# Patient Record
Sex: Male | Born: 1960 | Race: Asian | Hispanic: No | Marital: Married | Smoking: Never smoker
Health system: Southern US, Community
[De-identification: ages and names within clinical notes are randomized; demographics above are authoritative.]

## PROBLEM LIST (undated history)

## (undated) DIAGNOSIS — N400 Enlarged prostate without lower urinary tract symptoms: Secondary | ICD-10-CM

## (undated) DIAGNOSIS — N401 Enlarged prostate with lower urinary tract symptoms: Secondary | ICD-10-CM

## (undated) DIAGNOSIS — R3912 Poor urinary stream: Secondary | ICD-10-CM

## (undated) DIAGNOSIS — Z87438 Personal history of other diseases of male genital organs: Secondary | ICD-10-CM

## (undated) DIAGNOSIS — I10 Essential (primary) hypertension: Secondary | ICD-10-CM

## (undated) DIAGNOSIS — N410 Acute prostatitis: Secondary | ICD-10-CM

## (undated) DIAGNOSIS — C679 Malignant neoplasm of bladder, unspecified: Secondary | ICD-10-CM

## (undated) DIAGNOSIS — R309 Painful micturition, unspecified: Secondary | ICD-10-CM

## (undated) DIAGNOSIS — Z973 Presence of spectacles and contact lenses: Secondary | ICD-10-CM

## (undated) HISTORY — DX: Acute prostatitis: N41.0

## (undated) HISTORY — PX: NO PAST SURGERIES: SHX2092

## (undated) HISTORY — DX: Poor urinary stream: R39.12

## (undated) HISTORY — DX: Benign prostatic hyperplasia without lower urinary tract symptoms: N40.0

## (undated) HISTORY — DX: Painful micturition, unspecified: R30.9

---

## 2004-07-20 ENCOUNTER — Ambulatory Visit (HOSPITAL_COMMUNITY): Admission: RE | Admit: 2004-07-20 | Discharge: 2004-07-20 | Payer: Self-pay | Admitting: Family Medicine

## 2004-08-20 ENCOUNTER — Ambulatory Visit (HOSPITAL_COMMUNITY): Admission: RE | Admit: 2004-08-20 | Discharge: 2004-08-20 | Payer: Self-pay | Admitting: Neurology

## 2015-10-28 DIAGNOSIS — R7309 Other abnormal glucose: Secondary | ICD-10-CM | POA: Diagnosis not present

## 2015-10-28 DIAGNOSIS — E782 Mixed hyperlipidemia: Secondary | ICD-10-CM | POA: Diagnosis not present

## 2015-10-28 DIAGNOSIS — I10 Essential (primary) hypertension: Secondary | ICD-10-CM | POA: Diagnosis not present

## 2015-11-04 DIAGNOSIS — I10 Essential (primary) hypertension: Secondary | ICD-10-CM | POA: Diagnosis not present

## 2015-11-04 DIAGNOSIS — Z0000 Encounter for general adult medical examination without abnormal findings: Secondary | ICD-10-CM | POA: Diagnosis not present

## 2015-11-04 DIAGNOSIS — H5213 Myopia, bilateral: Secondary | ICD-10-CM | POA: Diagnosis not present

## 2015-11-04 DIAGNOSIS — H524 Presbyopia: Secondary | ICD-10-CM | POA: Diagnosis not present

## 2015-11-04 DIAGNOSIS — H2513 Age-related nuclear cataract, bilateral: Secondary | ICD-10-CM | POA: Diagnosis not present

## 2016-02-03 DIAGNOSIS — R7309 Other abnormal glucose: Secondary | ICD-10-CM | POA: Diagnosis not present

## 2016-02-03 DIAGNOSIS — Z008 Encounter for other general examination: Secondary | ICD-10-CM | POA: Diagnosis not present

## 2016-02-03 DIAGNOSIS — I10 Essential (primary) hypertension: Secondary | ICD-10-CM | POA: Diagnosis not present

## 2016-05-09 DIAGNOSIS — E781 Pure hyperglyceridemia: Secondary | ICD-10-CM | POA: Diagnosis not present

## 2016-05-09 DIAGNOSIS — Z008 Encounter for other general examination: Secondary | ICD-10-CM | POA: Diagnosis not present

## 2016-07-18 DIAGNOSIS — E782 Mixed hyperlipidemia: Secondary | ICD-10-CM | POA: Diagnosis not present

## 2016-07-18 DIAGNOSIS — R7309 Other abnormal glucose: Secondary | ICD-10-CM | POA: Diagnosis not present

## 2016-07-18 DIAGNOSIS — I10 Essential (primary) hypertension: Secondary | ICD-10-CM | POA: Diagnosis not present

## 2016-10-24 DIAGNOSIS — E781 Pure hyperglyceridemia: Secondary | ICD-10-CM | POA: Diagnosis not present

## 2016-10-24 DIAGNOSIS — I10 Essential (primary) hypertension: Secondary | ICD-10-CM | POA: Diagnosis not present

## 2016-10-24 DIAGNOSIS — R7309 Other abnormal glucose: Secondary | ICD-10-CM | POA: Diagnosis not present

## 2016-11-11 DIAGNOSIS — Z13220 Encounter for screening for lipoid disorders: Secondary | ICD-10-CM | POA: Diagnosis not present

## 2016-11-11 DIAGNOSIS — Z0000 Encounter for general adult medical examination without abnormal findings: Secondary | ICD-10-CM | POA: Diagnosis not present

## 2017-02-13 DIAGNOSIS — E781 Pure hyperglyceridemia: Secondary | ICD-10-CM | POA: Diagnosis not present

## 2017-02-13 DIAGNOSIS — R7309 Other abnormal glucose: Secondary | ICD-10-CM | POA: Diagnosis not present

## 2017-02-13 DIAGNOSIS — I10 Essential (primary) hypertension: Secondary | ICD-10-CM | POA: Diagnosis not present

## 2017-02-13 DIAGNOSIS — Z008 Encounter for other general examination: Secondary | ICD-10-CM | POA: Diagnosis not present

## 2017-05-10 DIAGNOSIS — E781 Pure hyperglyceridemia: Secondary | ICD-10-CM | POA: Diagnosis not present

## 2017-05-10 DIAGNOSIS — I10 Essential (primary) hypertension: Secondary | ICD-10-CM | POA: Diagnosis not present

## 2017-07-31 DIAGNOSIS — E781 Pure hyperglyceridemia: Secondary | ICD-10-CM | POA: Diagnosis not present

## 2017-07-31 DIAGNOSIS — I10 Essential (primary) hypertension: Secondary | ICD-10-CM | POA: Diagnosis not present

## 2017-07-31 DIAGNOSIS — R7309 Other abnormal glucose: Secondary | ICD-10-CM | POA: Diagnosis not present

## 2017-07-31 DIAGNOSIS — E785 Hyperlipidemia, unspecified: Secondary | ICD-10-CM | POA: Diagnosis not present

## 2017-10-04 DIAGNOSIS — H43812 Vitreous degeneration, left eye: Secondary | ICD-10-CM | POA: Diagnosis not present

## 2017-10-04 DIAGNOSIS — H2513 Age-related nuclear cataract, bilateral: Secondary | ICD-10-CM | POA: Diagnosis not present

## 2017-10-04 DIAGNOSIS — H538 Other visual disturbances: Secondary | ICD-10-CM | POA: Diagnosis not present

## 2017-11-13 DIAGNOSIS — E781 Pure hyperglyceridemia: Secondary | ICD-10-CM | POA: Diagnosis not present

## 2017-11-13 DIAGNOSIS — I10 Essential (primary) hypertension: Secondary | ICD-10-CM | POA: Diagnosis not present

## 2017-11-13 DIAGNOSIS — E785 Hyperlipidemia, unspecified: Secondary | ICD-10-CM | POA: Diagnosis not present

## 2017-11-13 DIAGNOSIS — R7309 Other abnormal glucose: Secondary | ICD-10-CM | POA: Diagnosis not present

## 2017-12-08 DIAGNOSIS — E782 Mixed hyperlipidemia: Secondary | ICD-10-CM | POA: Diagnosis not present

## 2017-12-08 DIAGNOSIS — Z0000 Encounter for general adult medical examination without abnormal findings: Secondary | ICD-10-CM | POA: Diagnosis not present

## 2017-12-08 DIAGNOSIS — I10 Essential (primary) hypertension: Secondary | ICD-10-CM | POA: Diagnosis not present

## 2017-12-08 DIAGNOSIS — Z125 Encounter for screening for malignant neoplasm of prostate: Secondary | ICD-10-CM | POA: Diagnosis not present

## 2018-01-23 DIAGNOSIS — Z01818 Encounter for other preprocedural examination: Secondary | ICD-10-CM | POA: Diagnosis not present

## 2018-01-23 DIAGNOSIS — Z1211 Encounter for screening for malignant neoplasm of colon: Secondary | ICD-10-CM | POA: Diagnosis not present

## 2018-02-05 DIAGNOSIS — E781 Pure hyperglyceridemia: Secondary | ICD-10-CM | POA: Diagnosis not present

## 2018-02-05 DIAGNOSIS — R7309 Other abnormal glucose: Secondary | ICD-10-CM | POA: Diagnosis not present

## 2018-02-05 DIAGNOSIS — E785 Hyperlipidemia, unspecified: Secondary | ICD-10-CM | POA: Diagnosis not present

## 2018-02-05 DIAGNOSIS — I10 Essential (primary) hypertension: Secondary | ICD-10-CM | POA: Diagnosis not present

## 2018-02-26 DIAGNOSIS — H5213 Myopia, bilateral: Secondary | ICD-10-CM | POA: Diagnosis not present

## 2018-02-26 DIAGNOSIS — H52222 Regular astigmatism, left eye: Secondary | ICD-10-CM | POA: Diagnosis not present

## 2018-05-02 DIAGNOSIS — R7309 Other abnormal glucose: Secondary | ICD-10-CM | POA: Diagnosis not present

## 2018-05-02 DIAGNOSIS — I10 Essential (primary) hypertension: Secondary | ICD-10-CM | POA: Diagnosis not present

## 2019-02-20 DIAGNOSIS — M7710 Lateral epicondylitis, unspecified elbow: Secondary | ICD-10-CM | POA: Diagnosis not present

## 2019-02-20 DIAGNOSIS — I10 Essential (primary) hypertension: Secondary | ICD-10-CM | POA: Diagnosis not present

## 2019-07-24 DIAGNOSIS — M549 Dorsalgia, unspecified: Secondary | ICD-10-CM | POA: Diagnosis not present

## 2019-08-01 DIAGNOSIS — R93422 Abnormal radiologic findings on diagnostic imaging of left kidney: Secondary | ICD-10-CM | POA: Diagnosis not present

## 2019-08-01 DIAGNOSIS — N281 Cyst of kidney, acquired: Secondary | ICD-10-CM | POA: Diagnosis not present

## 2019-08-01 DIAGNOSIS — R93421 Abnormal radiologic findings on diagnostic imaging of right kidney: Secondary | ICD-10-CM | POA: Diagnosis not present

## 2019-08-01 DIAGNOSIS — N329 Bladder disorder, unspecified: Secondary | ICD-10-CM | POA: Diagnosis not present

## 2019-11-27 DIAGNOSIS — R809 Proteinuria, unspecified: Secondary | ICD-10-CM | POA: Diagnosis not present

## 2019-11-27 DIAGNOSIS — Z0000 Encounter for general adult medical examination without abnormal findings: Secondary | ICD-10-CM | POA: Diagnosis not present

## 2019-11-27 DIAGNOSIS — I10 Essential (primary) hypertension: Secondary | ICD-10-CM | POA: Diagnosis not present

## 2019-11-28 DIAGNOSIS — Z0000 Encounter for general adult medical examination without abnormal findings: Secondary | ICD-10-CM | POA: Diagnosis not present

## 2019-11-28 DIAGNOSIS — R7301 Impaired fasting glucose: Secondary | ICD-10-CM | POA: Diagnosis not present

## 2019-11-28 DIAGNOSIS — Z13220 Encounter for screening for lipoid disorders: Secondary | ICD-10-CM | POA: Diagnosis not present

## 2019-11-28 DIAGNOSIS — Z125 Encounter for screening for malignant neoplasm of prostate: Secondary | ICD-10-CM | POA: Diagnosis not present

## 2019-11-28 DIAGNOSIS — E782 Mixed hyperlipidemia: Secondary | ICD-10-CM | POA: Diagnosis not present

## 2019-11-29 DIAGNOSIS — R809 Proteinuria, unspecified: Secondary | ICD-10-CM | POA: Diagnosis not present

## 2020-02-06 DIAGNOSIS — Z20822 Contact with and (suspected) exposure to covid-19: Secondary | ICD-10-CM | POA: Diagnosis not present

## 2020-07-16 DIAGNOSIS — M79621 Pain in right upper arm: Secondary | ICD-10-CM | POA: Diagnosis not present

## 2020-07-22 DIAGNOSIS — M25511 Pain in right shoulder: Secondary | ICD-10-CM | POA: Diagnosis not present

## 2020-10-27 DIAGNOSIS — I10 Essential (primary) hypertension: Secondary | ICD-10-CM | POA: Diagnosis not present

## 2021-01-19 DIAGNOSIS — I10 Essential (primary) hypertension: Secondary | ICD-10-CM | POA: Diagnosis not present

## 2021-01-19 DIAGNOSIS — E782 Mixed hyperlipidemia: Secondary | ICD-10-CM | POA: Diagnosis not present

## 2021-01-19 DIAGNOSIS — R899 Unspecified abnormal finding in specimens from other organs, systems and tissues: Secondary | ICD-10-CM | POA: Diagnosis not present

## 2021-01-19 DIAGNOSIS — Z0000 Encounter for general adult medical examination without abnormal findings: Secondary | ICD-10-CM | POA: Diagnosis not present

## 2021-01-19 DIAGNOSIS — Z125 Encounter for screening for malignant neoplasm of prostate: Secondary | ICD-10-CM | POA: Diagnosis not present

## 2021-03-01 DIAGNOSIS — N390 Urinary tract infection, site not specified: Secondary | ICD-10-CM | POA: Diagnosis not present

## 2021-03-01 DIAGNOSIS — N401 Enlarged prostate with lower urinary tract symptoms: Secondary | ICD-10-CM | POA: Diagnosis not present

## 2021-03-01 DIAGNOSIS — R300 Dysuria: Secondary | ICD-10-CM | POA: Diagnosis not present

## 2021-03-08 DIAGNOSIS — N401 Enlarged prostate with lower urinary tract symptoms: Secondary | ICD-10-CM | POA: Diagnosis not present

## 2021-03-08 DIAGNOSIS — N410 Acute prostatitis: Secondary | ICD-10-CM | POA: Diagnosis not present

## 2021-03-08 DIAGNOSIS — R3912 Poor urinary stream: Secondary | ICD-10-CM | POA: Diagnosis not present

## 2021-04-27 DIAGNOSIS — H524 Presbyopia: Secondary | ICD-10-CM | POA: Diagnosis not present

## 2021-04-27 DIAGNOSIS — H5213 Myopia, bilateral: Secondary | ICD-10-CM | POA: Diagnosis not present

## 2021-04-27 DIAGNOSIS — H52222 Regular astigmatism, left eye: Secondary | ICD-10-CM | POA: Diagnosis not present

## 2021-04-27 DIAGNOSIS — H2513 Age-related nuclear cataract, bilateral: Secondary | ICD-10-CM | POA: Diagnosis not present

## 2021-08-03 DIAGNOSIS — R310 Gross hematuria: Secondary | ICD-10-CM | POA: Diagnosis not present

## 2021-08-12 DIAGNOSIS — N400 Enlarged prostate without lower urinary tract symptoms: Secondary | ICD-10-CM | POA: Diagnosis not present

## 2021-08-12 DIAGNOSIS — R310 Gross hematuria: Secondary | ICD-10-CM | POA: Diagnosis not present

## 2021-08-12 DIAGNOSIS — N281 Cyst of kidney, acquired: Secondary | ICD-10-CM | POA: Diagnosis not present

## 2021-08-16 DIAGNOSIS — R310 Gross hematuria: Secondary | ICD-10-CM | POA: Diagnosis not present

## 2021-08-16 DIAGNOSIS — C672 Malignant neoplasm of lateral wall of bladder: Secondary | ICD-10-CM | POA: Diagnosis not present

## 2021-08-18 ENCOUNTER — Other Ambulatory Visit: Payer: Self-pay | Admitting: Urology

## 2021-08-20 ENCOUNTER — Other Ambulatory Visit: Payer: Self-pay

## 2021-09-07 ENCOUNTER — Encounter (HOSPITAL_BASED_OUTPATIENT_CLINIC_OR_DEPARTMENT_OTHER): Payer: Self-pay | Admitting: Urology

## 2021-09-09 ENCOUNTER — Encounter (HOSPITAL_BASED_OUTPATIENT_CLINIC_OR_DEPARTMENT_OTHER): Payer: Self-pay | Admitting: Urology

## 2021-09-09 ENCOUNTER — Other Ambulatory Visit: Payer: Self-pay

## 2021-09-09 NOTE — Progress Notes (Signed)
Spoke w/ via phone for pre-op interview--- pt ?Lab needs dos----   [facility] and ekg            ?Lab results------ no ?COVID test -----patient states asymptomatic no test needed ?Arrive at ------- 0700 on 09-10-2021 ?NPO after MN NO Solid Food.  Clear liquids from MN until--- 0600 ?Med rec completed ?Medications to take morning of surgery ----- none ?Diabetic medication ----- n/a ?Patient instructed no nail polish to be worn day of surgery ?Patient instructed to bring photo id and insurance card day of surgery ?Patient aware to have Driver (ride ) / caregiver for 24 hours after surgery --- wife, jiali ?Patient Special Instructions ----- n/a ?Pre-Op special Istructions ----- n/a ?Patient verbalized understanding of instructions that were given at this phone interview. ?Patient denies shortness of breath, chest pain, fever, cough at this phone interview.  ?

## 2021-09-10 ENCOUNTER — Ambulatory Visit (HOSPITAL_BASED_OUTPATIENT_CLINIC_OR_DEPARTMENT_OTHER): Payer: BC Managed Care – PPO | Admitting: Anesthesiology

## 2021-09-10 ENCOUNTER — Encounter (HOSPITAL_BASED_OUTPATIENT_CLINIC_OR_DEPARTMENT_OTHER): Payer: Self-pay | Admitting: Urology

## 2021-09-10 ENCOUNTER — Ambulatory Visit (HOSPITAL_BASED_OUTPATIENT_CLINIC_OR_DEPARTMENT_OTHER)
Admission: RE | Admit: 2021-09-10 | Discharge: 2021-09-10 | Disposition: A | Discharge status: Home / Self Care | Payer: BC Managed Care – PPO | Source: Ambulatory Visit | Attending: Urology | Admitting: Urology

## 2021-09-10 ENCOUNTER — Other Ambulatory Visit: Payer: Self-pay

## 2021-09-10 ENCOUNTER — Encounter (HOSPITAL_BASED_OUTPATIENT_CLINIC_OR_DEPARTMENT_OTHER)
Admission: RE | Disposition: A | Discharge status: Home / Self Care | Payer: Self-pay | Source: Ambulatory Visit | Attending: Urology

## 2021-09-10 DIAGNOSIS — D414 Neoplasm of uncertain behavior of bladder: Secondary | ICD-10-CM | POA: Diagnosis not present

## 2021-09-10 DIAGNOSIS — N319 Neuromuscular dysfunction of bladder, unspecified: Secondary | ICD-10-CM | POA: Diagnosis not present

## 2021-09-10 DIAGNOSIS — C679 Malignant neoplasm of bladder, unspecified: Secondary | ICD-10-CM | POA: Diagnosis not present

## 2021-09-10 DIAGNOSIS — N3081 Other cystitis with hematuria: Secondary | ICD-10-CM | POA: Insufficient documentation

## 2021-09-10 DIAGNOSIS — N419 Inflammatory disease of prostate, unspecified: Secondary | ICD-10-CM | POA: Diagnosis not present

## 2021-09-10 DIAGNOSIS — Z79899 Other long term (current) drug therapy: Secondary | ICD-10-CM | POA: Diagnosis not present

## 2021-09-10 DIAGNOSIS — C675 Malignant neoplasm of bladder neck: Secondary | ICD-10-CM | POA: Diagnosis not present

## 2021-09-10 DIAGNOSIS — I10 Essential (primary) hypertension: Secondary | ICD-10-CM | POA: Diagnosis not present

## 2021-09-10 DIAGNOSIS — C672 Malignant neoplasm of lateral wall of bladder: Secondary | ICD-10-CM | POA: Diagnosis not present

## 2021-09-10 DIAGNOSIS — N3080 Other cystitis without hematuria: Secondary | ICD-10-CM | POA: Diagnosis not present

## 2021-09-10 DIAGNOSIS — C680 Malignant neoplasm of urethra: Secondary | ICD-10-CM

## 2021-09-10 HISTORY — DX: Personal history of other diseases of male genital organs: Z87.438

## 2021-09-10 HISTORY — DX: Malignant neoplasm of bladder, unspecified: C67.9

## 2021-09-10 HISTORY — DX: Presence of spectacles and contact lenses: Z97.3

## 2021-09-10 HISTORY — PX: TRANSURETHRAL RESECTION OF BLADDER TUMOR WITH MITOMYCIN-C: SHX6459

## 2021-09-10 HISTORY — DX: Benign prostatic hyperplasia with lower urinary tract symptoms: N40.1

## 2021-09-10 HISTORY — DX: Essential (primary) hypertension: I10

## 2021-09-10 LAB — POCT I-STAT, CHEM 8
BUN: 14 mg/dL (ref 8–23)
Calcium, Ion: 1.24 mmol/L (ref 1.15–1.40)
Chloride: 102 mmol/L (ref 98–111)
Creatinine, Ser: 0.9 mg/dL (ref 0.61–1.24)
Glucose, Bld: 117 mg/dL — ABNORMAL HIGH (ref 70–99)
HCT: 48 % (ref 39.0–52.0)
Hemoglobin: 16.3 g/dL (ref 13.0–17.0)
Potassium: 3.7 mmol/L (ref 3.5–5.1)
Sodium: 142 mmol/L (ref 135–145)
TCO2: 26 mmol/L (ref 22–32)

## 2021-09-10 SURGERY — TRANSURETHRAL RESECTION OF BLADDER TUMOR WITH MITOMYCIN-C
Anesthesia: General | Site: Bladder | Laterality: Bilateral

## 2021-09-10 MED ORDER — LACTATED RINGERS IV SOLN: INTRAVENOUS | Status: DC

## 2021-09-10 MED ORDER — ONDANSETRON HCL 4 MG/2ML IJ SOLN
INTRAMUSCULAR | Status: AC
  Filled 2021-09-10: qty 2

## 2021-09-10 MED ORDER — DEXAMETHASONE SODIUM PHOSPHATE 10 MG/ML IJ SOLN
INTRAMUSCULAR | Status: DC | PRN
  Administered 2021-09-10: 10 mg via INTRAVENOUS

## 2021-09-10 MED ORDER — ONDANSETRON HCL 4 MG/2ML IJ SOLN
INTRAMUSCULAR | Status: DC | PRN
Start: 2021-09-10 — End: 2021-09-10
  Administered 2021-09-10: 4 mg via INTRAVENOUS

## 2021-09-10 MED ORDER — FENTANYL CITRATE (PF) 100 MCG/2ML IJ SOLN
25.0000 ug | INTRAMUSCULAR | Status: DC | PRN
  Administered 2021-09-10: 50 ug via INTRAVENOUS

## 2021-09-10 MED ORDER — IOHEXOL 300 MG/ML  SOLN
INTRAMUSCULAR | Status: DC | PRN
  Administered 2021-09-10: 10 mL via URETHRAL

## 2021-09-10 MED ORDER — ONDANSETRON HCL 4 MG/2ML IJ SOLN: 4.0000 mg | Freq: Once | INTRAMUSCULAR | Status: DC | PRN

## 2021-09-10 MED ORDER — FENTANYL CITRATE (PF) 100 MCG/2ML IJ SOLN
INTRAMUSCULAR | Status: DC | PRN
  Administered 2021-09-10 (×2): 50 ug via INTRAVENOUS

## 2021-09-10 MED ORDER — MIDAZOLAM HCL 2 MG/2ML IJ SOLN
INTRAMUSCULAR | Status: AC
  Filled 2021-09-10: qty 2

## 2021-09-10 MED ORDER — PROPOFOL 10 MG/ML IV BOLUS
INTRAVENOUS | Status: DC | PRN
  Administered 2021-09-10: 150 mg via INTRAVENOUS

## 2021-09-10 MED ORDER — CEFAZOLIN SODIUM-DEXTROSE 2-4 GM/100ML-% IV SOLN
INTRAVENOUS | Status: AC
  Filled 2021-09-10: qty 100

## 2021-09-10 MED ORDER — FENTANYL CITRATE (PF) 100 MCG/2ML IJ SOLN
INTRAMUSCULAR | Status: AC
  Filled 2021-09-10: qty 2

## 2021-09-10 MED ORDER — SODIUM CHLORIDE 0.9 % IR SOLN
Status: DC | PRN
  Administered 2021-09-10: 1000 mL
  Administered 2021-09-10: 3000 mL

## 2021-09-10 MED ORDER — OXYCODONE HCL 5 MG PO TABS: 5.0000 mg | ORAL_TABLET | Freq: Once | ORAL | Status: DC | PRN

## 2021-09-10 MED ORDER — AMISULPRIDE (ANTIEMETIC) 5 MG/2ML IV SOLN: 10.0000 mg | Freq: Once | INTRAVENOUS | Status: DC | PRN

## 2021-09-10 MED ORDER — OXYCODONE HCL 5 MG/5ML PO SOLN: 5.0000 mg | Freq: Once | ORAL | Status: DC | PRN

## 2021-09-10 MED ORDER — LIDOCAINE HCL (CARDIAC) PF 100 MG/5ML IV SOSY
PREFILLED_SYRINGE | INTRAVENOUS | Status: DC | PRN
  Administered 2021-09-10: 50 mg via INTRAVENOUS

## 2021-09-10 MED ORDER — GEMCITABINE CHEMO FOR BLADDER INSTILLATION 2000 MG
2000.0000 mg | Freq: Once | INTRAVENOUS | Status: AC
  Administered 2021-09-10: 2000 mg via INTRAVESICAL
  Filled 2021-09-10: qty 2000

## 2021-09-10 MED ORDER — MIDAZOLAM HCL 5 MG/5ML IJ SOLN
INTRAMUSCULAR | Status: DC | PRN
  Administered 2021-09-10: 2 mg via INTRAVENOUS

## 2021-09-10 MED ORDER — ACETAMINOPHEN 500 MG PO TABS
ORAL_TABLET | ORAL | Status: AC
  Filled 2021-09-10: qty 2

## 2021-09-10 MED ORDER — CEFAZOLIN SODIUM-DEXTROSE 2-4 GM/100ML-% IV SOLN
2.0000 g | INTRAVENOUS | Status: AC
  Administered 2021-09-10: 2 g via INTRAVENOUS

## 2021-09-10 MED ORDER — DEXAMETHASONE SODIUM PHOSPHATE 10 MG/ML IJ SOLN
INTRAMUSCULAR | Status: AC
  Filled 2021-09-10: qty 1

## 2021-09-10 MED ORDER — ACETAMINOPHEN 500 MG PO TABS
1000.0000 mg | ORAL_TABLET | Freq: Once | ORAL | Status: AC
  Administered 2021-09-10: 1000 mg via ORAL

## 2021-09-10 MED ORDER — TRAMADOL HCL 50 MG PO TABS: 50.0000 mg | ORAL_TABLET | Freq: Four times a day (QID) | ORAL | 0 refills | Status: DC | PRN

## 2021-09-10 MED ORDER — PROPOFOL 10 MG/ML IV BOLUS
INTRAVENOUS | Status: AC
Start: 2021-09-10 — End: ?
  Filled 2021-09-10: qty 20

## 2021-09-10 SURGICAL SUPPLY — 26 items
BAG DRAIN URO-CYSTO SKYTR STRL (DRAIN) ×3 IMPLANT
BAG DRN RND TRDRP ANRFLXCHMBR (UROLOGICAL SUPPLIES) ×1
BAG DRN UROCATH (DRAIN) ×1
BAG URINE DRAIN 2000ML AR STRL (UROLOGICAL SUPPLIES) ×3 IMPLANT
CATH FOLEY 2WAY SLVR  5CC 18FR (CATHETERS) ×2
CATH FOLEY 2WAY SLVR 5CC 18FR (CATHETERS) IMPLANT
CATH FOLEY 3WAY 30CC 22FR (CATHETERS) ×3 IMPLANT
CATH INTERMIT  6FR 70CM (CATHETERS) ×1 IMPLANT
CLOTH BEACON ORANGE TIMEOUT ST (SAFETY) ×3 IMPLANT
ELECT REM PT RETURN 9FT ADLT (ELECTROSURGICAL)
ELECTRODE REM PT RTRN 9FT ADLT (ELECTROSURGICAL) IMPLANT
EVACUATOR MICROVAS BLADDER (UROLOGICAL SUPPLIES) IMPLANT
GLOVE BIO SURGEON STRL SZ7.5 (GLOVE) ×3 IMPLANT
GOWN STRL REUS W/TWL LRG LVL3 (GOWN DISPOSABLE) ×6 IMPLANT
GUIDEWIRE STR DUAL SENSOR (WIRE) ×1 IMPLANT
HOLDER FOLEY CATH W/STRAP (MISCELLANEOUS) ×1 IMPLANT
IV NS IRRIG 3000ML ARTHROMATIC (IV SOLUTION) ×10 IMPLANT
KIT TURNOVER CYSTO (KITS) ×3 IMPLANT
LOOP CUT BIPOLAR 24F LRG (ELECTROSURGICAL) ×1 IMPLANT
LOOP MONOPOLAR YLW (ELECTROSURGICAL) IMPLANT
MANIFOLD NEPTUNE II (INSTRUMENTS) ×3 IMPLANT
PACK CYSTO (CUSTOM PROCEDURE TRAY) ×3 IMPLANT
SYR 30ML LL (SYRINGE) IMPLANT
TUBE CONNECTING 12X1/4 (SUCTIONS) IMPLANT
TUBING UROLOGY SET (TUBING) ×1 IMPLANT
WATER STERILE IRR 500ML POUR (IV SOLUTION) ×3 IMPLANT

## 2021-09-10 NOTE — Op Note (Signed)
Preoperative diagnosis:  ?Bladder tumor, 2 cm, left bladder neck ? ?Postoperative diagnosis:  ?Same ? ?Procedure: ?Cystoscopy, bilateral retrograde pyelogram with interpretation ?Transurethral section of bladder tumor, 2 cm, left bladder neck ?Prostatic urethral biopsy with fulguration ?Postoperative instillation of gemcitabine intravesically ? ?Surgeon: Ardis Hughs, MD ? ?Anesthesia: General ? ?Complications: None ? ?Intraoperative findings:  ?1: Patient's right retrograde pyelogram was performed with a 5 [place] open-ended ureteral catheter and 10 cc of Omnipaque contrast demonstrating a normal caliber ureter with no filling defects or abnormalities.  There is no hydroureteronephrosis. ?#2: The patient's left retrograde pyelogram was performed in a similar fashion using 10 cc of Omnipaque contrast and demonstrated normal caliber ureter at no filling defects or significant abnormalities.  There is no hydroureteronephrosis. ?#3: The patient had a 2 cm papillary lesion on a narrow stalk at the 3 o'clock position of the bladder neck. ?#4: The prostatic urethra had some frondular papillary lesion diffusely that I biopsied and fulgurated. ? ?EBL: Minimal ? ?Specimens:  ?#1: Bladder tumor, left bladder neck ?#2: Bladder tumor base ?#3: Prostatic urethral biopsy ? ?Indication: John Patrick is a 61 y.o. patient with several days of gross hematuria.  Evaluation demonstrated a tumor in the left bladder neck.  After reviewing the management options for treatment, he elected to proceed with the above surgical procedure(s). We have discussed the potential benefits and risks of the procedure, side effects of the proposed treatment, the likelihood of the patient achieving the goals of the procedure, and any potential problems that might occur during the procedure or recuperation. Informed consent has been obtained. ? ?Description of procedure: ? ?The patient was taken to the operating room and general anesthesia was  induced.  The patient was placed in the dorsal lithotomy position, prepped and draped in the usual sterile fashion, and preoperative antibiotics were administered. A preoperative time-out was performed.  ? ?32 French degree cystoscope was gently passed through the patient's reason in the bladder under visual guidance.  Cystoscopy was performed with 306 degree fashion demonstrating the above findings.  I exchanged the 0 degree lens for the 70 degree lens and repeated cystoscopy with no additional findings.  I subsequently performed retrograde pyelograms with the above findings.  I then exchanged the 21 French sheath for the 26 French resectoscope sheath.  In the routine fashion the bladder tumor was resected and additional bladder tumor base biopsies were taken.  Hemostasis was subsequently achieved.  I then noted the patient had some slight abnormalities of the prostatic urethra as described above.  I opted to biopsy these areas with the biopsy forceps and then fulgurated them.  I subsequently passed an 37 French Foley catheter in and there was no significant hematuria. ? ?The patient was subsequently awoken and returned to the PACU in stable condition. ? ?In the PACU, ~29m's with 2gm of Gemcitabine was instilled into the patient's bladder through an 191French Foley catheter.  This was allowed to dwell for 60-90 minutes prior to emptying the Foley catheter and removing it. ? ? ?BArdis Hughs M.D. ?  ?

## 2021-09-10 NOTE — Interval H&P Note (Signed)
History and Physical Interval Note: ? ?09/10/2021 ?8:30 AM ? ?John Patrick  has presented today for surgery, with the diagnosis of BLADDER CANCER.  The various methods of treatment have been discussed with the patient and family. After consideration of risks, benefits and other options for treatment, the patient has consented to  Procedure(s): ?TRANSURETHRAL RESECTION OF BLADDER TUMOR WITH GEMCITABINE BILATERAL RETROGRADE PYELOGRAM (Bilateral) as a surgical intervention.  The patient's history has been reviewed, patient examined, no change in status, stable for surgery.  I have reviewed the patient's chart and labs.  Questions were answered to the patient's satisfaction.   ? ? ?Ardis Hughs ? ? ?

## 2021-09-10 NOTE — Anesthesia Postprocedure Evaluation (Signed)
Anesthesia Post Note ? ?Patient: John Patrick ? ?Procedure(s) Performed: TRANSURETHRAL RESECTION OF BLADDER TUMOR WITH POST OPERATIVE INSTILLATION OF GEMCITABINE BILATERAL RETROGRADE PYELOGRAM (Bilateral: Bladder) ? ?  ? ?Patient location during evaluation: PACU ?Anesthesia Type: General ?Level of consciousness: awake ?Pain management: pain level controlled ?Vital Signs Assessment: post-procedure vital signs reviewed and stable ?Respiratory status: spontaneous breathing and respiratory function stable ?Cardiovascular status: stable ?Postop Assessment: no apparent nausea or vomiting ?Anesthetic complications: no ? ? ?No notable events documented. ? ?Last Vitals:  ?Vitals:  ? 09/10/21 1100 09/10/21 1200  ?BP: 134/72 (!) 168/99  ?Pulse: 63 66  ?Resp: 10 16  ?Temp:    ?SpO2: 98% 97%  ?  ?Last Pain:  ?Vitals:  ? 09/10/21 1200  ?PainSc: 0-No pain  ? ? ?  ?  ?  ?  ?  ?  ? ?Merlinda Frederick ? ? ? ? ?

## 2021-09-10 NOTE — H&P (Signed)
61 year old male presents today for follow-up. I saw the patient in October. At that time we treated him for prostatitis. I started him on tamsulosin. He noted that he had significant lightheadedness when taking it daily. He subsequently reduced it to every other day and now twice a week. He states that with the current regimen he has significant improvement in his stream, frequency, and no longer having any pain.  ? ?The patient was seen 2 weeks ago, for gross hematuria. His urine culture was negative. A CT scan demonstrated no clear evidence of any upper tract issues or pathology. The blood has stopped, and he is voiding normally at this point.  ? ?  ?ALLERGIES: None  ? ?MEDICATIONS: Losartan-Hydrochlorothiazide 50 mg-12.5 mg tablet  ?  ? ?GU PSH: Locm 300-'399Mg'$ /Ml Iodine,1Ml - 08/12/2021 ? ?  ? ?NON-GU PSH: None  ? ?GU PMH: Gross hematuria - 08/12/2021, - 08/03/2021 ?Acute prostatitis - 03/08/2021 ?BPH w/LUTS - 03/08/2021 ?Weak Urinary Stream - 03/08/2021 ?  ? ?NON-GU PMH: None  ? ?FAMILY HISTORY: None  ? ?SOCIAL HISTORY: Marital Status: Married ?Race: Asian ?Current Smoking Status: Patient has never smoked.  ? ?Tobacco Use Assessment Completed: Used Tobacco in last 30 days? ?Has never drank.  ?Does not use drugs. ?Does not drink caffeine. ?  ? ?REVIEW OF SYSTEMS:    ?GU Review Male:   Patient denies frequent urination, hard to postpone urination, burning/ pain with urination, get up at night to urinate, leakage of urine, stream starts and stops, trouble starting your stream, have to strain to urinate , erection problems, and penile pain.  ?Gastrointestinal (Upper):   Patient denies nausea, vomiting, and indigestion/ heartburn.  ?Gastrointestinal (Lower):   Patient denies diarrhea and constipation.  ?Constitutional:   Patient denies fever, night sweats, weight loss, and fatigue.  ?Skin:   Patient denies skin rash/ lesion and itching.  ?Eyes:   Patient denies blurred vision and double vision.  ?Ears/ Nose/ Throat:    Patient denies sinus problems and sore throat.  ?Hematologic/Lymphatic:   Patient denies swollen glands and easy bruising.  ?Cardiovascular:   Patient denies leg swelling and chest pains.  ?Respiratory:   Patient denies cough and shortness of breath.  ?Endocrine:   Patient denies excessive thirst.  ?Musculoskeletal:   Patient denies back pain and joint pain.  ?Neurological:   Patient denies headaches and dizziness.  ?Psychologic:   Patient denies depression and anxiety.  ? ?VITAL SIGNS: None  ? ?MULTI-SYSTEM PHYSICAL EXAMINATION:    ?Constitutional: Well-nourished. No physical deformities. Normally developed. Good grooming.  ?Respiratory: Normal breath sounds. No labored breathing, no use of accessory muscles.   ?Cardiovascular: Regular rate and rhythm. No murmur, no gallop. Normal temperature, normal extremity pulses, no swelling, no varicosities.   ? ?  ?Complexity of Data:  ?Source Of History:  Patient  ?Records Review:   Previous Doctor Records, Previous Patient Records  ?Urine Test Review:   Urinalysis  ?X-Ray Review: C.T. Abdomen/Pelvis: Reviewed Films. Discussed With Patient.  ?  ? ?PROCEDURES:    ?     Flexible Cystoscopy - 52000  ?Risks, benefits, and some of the potential complications of the procedure were discussed at length with the patient including infection, bleeding, voiding discomfort, urinary retention, fever, chills, sepsis, and others. All questions were answered. Informed consent was obtained. Sterile technique and intraurethral analgesia were used.  ?Meatus:  Normal size. Normal location. Normal condition.  ?Urethra:  No strictures.  ?External Sphincter:  Normal.  ?Verumontanum:  Normal.  ?Prostate:  Non-obstructing. No hyperplasia.  ?Bladder Neck:  Non-obstructing.  ?Ureteral Orifices:  Normal location. Normal size. Normal shape. Effluxed clear urine.  ?Bladder:  1 1/2 cm tumor 3:00 on the bladder neck. No trabeculation. Normal mucosa. No stones.  ?  ?  ?The lower urinary tract was carefully  examined. The procedure was well-tolerated and without complications. Antibiotic instructions were given. Instructions were given to call the office immediately for bloody urine, difficulty urinating, urinary retention, painful or frequent urination, fever, chills, nausea, vomiting or other illness. The patient stated that he understood these instructions and would comply with them.  ? ? ?     Urinalysis - 81003 ?Dipstick Dipstick Cont'd  ?Color: Yellow Bilirubin: Neg  ?Appearance: Clear Ketones: Neg  ?Specific Gravity: 1.010 Blood: Neg  ?pH: 6.0 Protein: Neg  ?Glucose: Neg Urobilinogen: 0.2  ?  Nitrites: Neg  ?  Leukocyte Esterase: Neg  ? ? ?Notes:  ? ?  ? ?ASSESSMENT:  ?    ICD-10 Details  ?1 GU:   Gross hematuria - R31.0   ?2   Bladder Cancer Lateral - C67.2   ? ?PLAN:    ? ?      Schedule ?Return Visit/Planned Activity: ASAP - Schedule Surgery  ? ? ?      Document ?Letter(s):  Created for Patient: Clinical Summary  ? ? ?     Notes:   The patient has a 1.5 to 2 cm bladder tumor at the bladder neck. I went through the diagnosis with the patient and the treatment algorithm. I recommended that we proceed to the operating room for TURBT, bilateral retrograde pyelograms, and postoperative gemcitabine instillation. We will try to get this scheduled for him quickly.  ?  ? ?

## 2021-09-10 NOTE — Anesthesia Preprocedure Evaluation (Addendum)
Anesthesia Evaluation  ?Patient identified by MRN, date of birth, ID band ?Patient awake ? ? ? ?Reviewed: ?Allergy & Precautions, NPO status , Patient's Chart, lab work & pertinent test results ? ?Airway ?Mallampati: II ? ?TM Distance: >3 FB ?Neck ROM: Full ? ? ? Dental ?no notable dental hx. ? ?  ?Pulmonary ?neg pulmonary ROS,  ?  ?Pulmonary exam normal ?breath sounds clear to auscultation ? ? ? ? ? ? Cardiovascular ?hypertension, Pt. on medications ?negative cardio ROS ?Normal cardiovascular exam ?Rhythm:Regular Rate:Normal ? ? ?  ?Neuro/Psych ?negative neurological ROS ? negative psych ROS  ? GI/Hepatic ?negative GI ROS, Neg liver ROS,   ?Endo/Other  ?negative endocrine ROS ? Renal/GU ?negative Renal ROS Bladder dysfunction ? ? ? ?  ?Musculoskeletal ?negative musculoskeletal ROS ?(+)  ? Abdominal ?  ?Peds ?negative pediatric ROS ?(+)  Hematology ?negative hematology ROS ?(+)   ?Anesthesia Other Findings ?Bladder cancer ? Reproductive/Obstetrics ?negative OB ROS ? ?  ? ? ? ? ? ? ? ? ? ? ? ? ? ?  ?  ? ? ? ? ? ? ? ?Anesthesia Physical ?Anesthesia Plan ? ?ASA: 3 ? ?Anesthesia Plan: General  ? ?Post-op Pain Management:   ? ?Induction: Intravenous ? ?PONV Risk Score and Plan: 2 and Treatment may vary due to age or medical condition, Ondansetron and Dexamethasone ? ?Airway Management Planned: Oral ETT and LMA ? ?Additional Equipment: None ? ?Intra-op Plan:  ? ?Post-operative Plan: Extubation in OR ? ?Informed Consent: I have reviewed the patients History and Physical, chart, labs and discussed the procedure including the risks, benefits and alternatives for the proposed anesthesia with the patient or authorized representative who has indicated his/her understanding and acceptance.  ? ? ? ?Dental advisory given ? ?Plan Discussed with: Anesthesiologist and CRNA ? ?Anesthesia Plan Comments: (LMA vs. ETT pending surgical needs. Norton Blizzard, MD  ?)  ? ? ? ? ? ?Anesthesia Quick Evaluation ? ?

## 2021-09-10 NOTE — Discharge Instructions (Addendum)
Transurethral Resection of Bladder Tumor (TURBT) or Bladder Biopsy   Definition:  Transurethral Resection of the Bladder Tumor is a surgical procedure used to diagnose and remove tumors within the bladder. TURBT is the most common treatment for early stage bladder cancer.  General instructions:     Your recent bladder surgery requires very little post hospital care but some definite precautions.  Despite the fact that no skin incisions were used, the area around the bladder incisions are raw and covered with scabs to promote healing and prevent bleeding. Certain precautions are needed to insure that the scabs are not disturbed over the next 2-4 weeks while the healing proceeds.  Because the raw surface inside your bladder and the irritating effects of urine you may expect frequency of urination and/or urgency (a stronger desire to urinate) and perhaps even getting up at night more often. This will usually resolve or improve slowly over the healing period. You may see some blood in your urine over the first 6 weeks. Do not be alarmed, even if the urine was clear for a while. Get off your feet and drink lots of fluids until clearing occurs. If you start to pass clots or don't improve call us.  Diet:  You may return to your normal diet immediately. Because of the raw surface of your bladder, alcohol, spicy foods, foods high in acid and drinks with caffeine may cause irritation or frequency and should be used in moderation. To keep your urine flowing freely and avoid constipation, drink plenty of fluids during the day (8-10 glasses). Tip: Avoid cranberry juice because it is very acidic.  Activity:  Your physical activity doesn't need to be restricted. However, if you are very active, you may see some blood in the urine. We suggest that you reduce your activity under the circumstances until the bleeding has stopped.  Bowels:  It is important to keep your bowels regular during the postoperative  period. Straining with bowel movements can cause bleeding. A bowel movement every other day is reasonable. Use a mild laxative if needed, such as milk of magnesia 2-3 tablespoons, or 2 Dulcolax tablets. Call if you continue to have problems. If you had been taking narcotics for pain, before, during or after your surgery, you may be constipated. Take a laxative if necessary.    Medication:  You should resume your pre-surgery medications unless told not to. In addition you may be given an antibiotic to prevent or treat infection. Antibiotics are not always necessary. All medication should be taken as prescribed until the bottles are finished unless you are having an unusual reaction to one of the drugs.   Post Anesthesia Home Care Instructions  Activity: Get plenty of rest for the remainder of the day. A responsible individual must stay with you for 24 hours following the procedure.  For the next 24 hours, DO NOT: -Drive a car -Operate machinery -Drink alcoholic beverages -Take any medication unless instructed by your physician -Make any legal decisions or sign important papers.  Meals: Start with liquid foods such as gelatin or soup. Progress to regular foods as tolerated. Avoid greasy, spicy, heavy foods. If nausea and/or vomiting occur, drink only clear liquids until the nausea and/or vomiting subsides. Call your physician if vomiting continues.  Special Instructions/Symptoms: Your throat may feel dry or sore from the anesthesia or the breathing tube placed in your throat during surgery. If this causes discomfort, gargle with warm salt water. The discomfort should disappear within 24 hours.       

## 2021-09-10 NOTE — Anesthesia Procedure Notes (Signed)
Procedure Name: LMA Insertion ?Date/Time: 09/10/2021 8:53 AM ?Performed by: Jonna Munro, CRNA ?Pre-anesthesia Checklist: Patient identified, Emergency Drugs available, Suction available, Patient being monitored and Timeout performed ?Patient Re-evaluated:Patient Re-evaluated prior to induction ?Oxygen Delivery Method: Circle system utilized ?Preoxygenation: Pre-oxygenation with 100% oxygen ?Induction Type: IV induction ?LMA: LMA inserted ?LMA Size: 4.0 ?Number of attempts: 1 ?Placement Confirmation: positive ETCO2 and breath sounds checked- equal and bilateral ?Tube secured with: Tape ?Dental Injury: Teeth and Oropharynx as per pre-operative assessment  ? ? ? ? ?

## 2021-09-10 NOTE — Transfer of Care (Signed)
Immediate Anesthesia Transfer of Care Note ? ?Patient: John Patrick ? ?Procedure(s) Performed: TRANSURETHRAL RESECTION OF BLADDER TUMOR WITH POST OPERATIVE INSTILLATION OF GEMCITABINE BILATERAL RETROGRADE PYELOGRAM (Bilateral: Bladder) ? ?Patient Location: PACU ? ?Anesthesia Type:General ? ?Level of Consciousness: awake, alert , oriented and patient cooperative ? ?Airway & Oxygen Therapy: Patient Spontanous Breathing and Patient connected to face mask oxygen ? ?Post-op Assessment: Report given to RN, Post -op Vital signs reviewed and stable and Patient moving all extremities X 4 ? ?Post vital signs: Reviewed and stable ? ?Last Vitals:  ?Vitals Value Taken Time  ?BP 138/87 09/10/21 0937  ?Temp    ?Pulse 58 09/10/21 0939  ?Resp 11 09/10/21 0939  ?SpO2 100 % 09/10/21 0939  ?Vitals shown include unvalidated device data. ? ?Last Pain:  ?Vitals:  ? 09/10/21 0738  ?PainSc: 0-No pain  ?   ? ?Patients Stated Pain Goal: 7 (09/10/21 5597) ? ?Complications: No notable events documented. ?

## 2021-09-13 LAB — SURGICAL PATHOLOGY

## 2021-09-14 ENCOUNTER — Encounter (HOSPITAL_BASED_OUTPATIENT_CLINIC_OR_DEPARTMENT_OTHER): Payer: Self-pay | Admitting: Urology

## 2021-09-27 ENCOUNTER — Encounter: Payer: Self-pay | Admitting: Surgery

## 2021-09-27 ENCOUNTER — Ambulatory Visit (INDEPENDENT_AMBULATORY_CARE_PROVIDER_SITE_OTHER): Payer: BC Managed Care – PPO | Admitting: Surgery

## 2021-09-27 VITALS — BP 117/78 | HR 74 | Temp 98.5°F | Resp 20 | Ht 70.0 in | Wt 200.0 lb

## 2021-09-27 DIAGNOSIS — I728 Aneurysm of other specified arteries: Secondary | ICD-10-CM | POA: Diagnosis not present

## 2021-09-27 NOTE — Progress Notes (Signed)
? ?[facility] ? ?Patient name: John Patrick MRN: [id number] DOB: April 22, 1961 Sex: male ? ? ?REQUESTING PROVIDER:  ? ? Dr. Jacelyn Grip ? ? ?REASON FOR CONSULT:  ?  ?Splenic artery aneurysm ? ?HISTORY OF PRESENT ILLNESS:  ? ?John Patrick is a 61 y.o. 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  He is not having any abdominal pain.  He recently underwent treatment for bladder cancer and is recovering from that.  His other medical issues include hypertension.  He is a non-smoker. ? ?PAST MEDICAL HISTORY  ? ? ?Past Medical History:  ?Diagnosis Date  ? Bladder cancer ([place])   ? urologist-- dr Louis Meckel  ? BPH (benign prostatic hyperplasia)   ? H/O acute prostatitis   ? Hypertension   ? Wears glasses   ? ? ? ?FAMILY HISTORY  ? ?History reviewed. No pertinent family history. ? ?SOCIAL HISTORY:  ? ?Social History  ? ?Socioeconomic History  ? Marital status: Married  ?  Spouse name: Not on file  ? Number of children: Not on file  ? Years of education: Not on file  ? Highest education level: Not on file  ?Occupational History  ? Not on file  ?Tobacco Use  ? Smoking status: Never  ? Smokeless tobacco: Never  ?Vaping Use  ? Vaping Use: Never used  ?Substance and Sexual Activity  ? Alcohol use: Not Currently  ? Drug use: Never  ? Sexual activity: Not on file  ?Other Topics Concern  ? Not on file  ?Social History Narrative  ? Not on file  ? ?Social Determinants of Health  ? ?Financial Resource Strain: Not on file  ?Food Insecurity: Not on file  ?Transportation Needs: Not on file  ?Physical Activity: Not on file  ?Stress: Not on file  ?Social Connections: Not on file  ?Intimate Partner Violence: Not on file  ? ? ?ALLERGIES:  ? ? ?No Known Allergies ? ?CURRENT MEDICATIONS:  ? ? ?Current Outpatient Medications  ?Medication Sig Dispense Refill  ? losartan-hydrochlorothiazide (HYZAAR) 50-12.5 MG tablet Take 1 tablet by mouth daily.    ? tamsulosin (FLOMAX)  0.4 MG CAPS capsule Take 0.4 mg by mouth every other day.    ? ?No current facility-administered medications for this visit.  ? ? ?REVIEW OF SYSTEMS:  ? ?'[X]'$  denotes positive finding, '[ ]'$  denotes negative finding ?Cardiac  Comments:  ?Chest pain or chest pressure:    ?Shortness of breath upon exertion:    ?Short of breath when lying flat:    ?Irregular heart rhythm:    ?    ?Vascular    ?Pain in calf, thigh, or hip brought on by ambulation:    ?Pain in feet at night that wakes you up from your sleep:     ?Blood clot in your veins:    ?Leg swelling:     ?    ?Pulmonary    ?Oxygen at home:    ?Productive cough:     ?Wheezing:     ?    ?Neurologic    ?Sudden weakness in arms or legs:     ?Sudden numbness in arms or legs:     ?Sudden onset of difficulty speaking or slurred speech:    ?Temporary loss of vision in one eye:     ?Problems with dizziness:     ?    ?Gastrointestinal    ?Blood in stool:     ? ?Vomited blood:     ?    ?  Genitourinary    ?Burning when urinating:     ?Blood in urine:    ?    ?Psychiatric    ?Major depression:     ?    ?Hematologic    ?Bleeding problems:    ?Problems with blood clotting too easily:    ?    ?Skin    ?Rashes or ulcers:    ?    ?Constitutional    ?Fever or chills:    ? ?PHYSICAL EXAM:  ? ?Vitals:  ? 09/27/21 1515  ?BP: 117/78  ?Pulse: 74  ?Resp: 20  ?Temp: 98.5 ?F (36.9 ?C)  ?SpO2: 94%  ?Weight: 200 lb (90.7 kg)  ?Height: '5\' 10"'$  (1.778 m)  ? ? ?GENERAL: The patient is a well-nourished male, in no acute distress. The vital signs are documented above. ?CARDIAC: There is a regular rate and rhythm.  ?PULMONARY: Nonlabored respirations ?ABDOMEN: Soft and non-tender.  ?MUSCULOSKELETAL: There are no major deformities or cyanosis. ?NEUROLOGIC: No focal weakness or paresthesias are detected. ?SKIN: There are no ulcers or rashes noted. ?PSYCHIATRIC: The patient has a normal affect. ? ?STUDIES:  ? ?He came with a outside CT scan which I reviewed showing a mid to distal large calcified splenic  artery aneurysm. ? ?ASSESSMENT and PLAN  ? ?Splenic aneurysm: I had a lengthy discussion with the patient regarding her treatment options.  After my review of the CT scan, I think he is a candidate for endovascular repair.  Potentially if the tortuosity is not too bad, I could get a covered stent in place to treat his aneurysm.  If not, he would need coil embolization.  The CT scan he had was not an arterial scan.  We talked about getting better imaging so that I can better plan his procedure.  I will get a CT angiogram of the abdomen pelvis in the near future and have him return for further discussions.  I talked about the risk of splenic infarction or loss of the spleen if I perform embolization.  Regardless, I would like to get him vaccinated prior to proceeding. ? ? ?Annamarie Major, IV, MD, FACS ?[facility] ?Tel [phone] ?Pager [phone]  ?

## 2021-10-12 ENCOUNTER — Other Ambulatory Visit: Payer: Self-pay

## 2021-10-12 DIAGNOSIS — I728 Aneurysm of other specified arteries: Secondary | ICD-10-CM

## 2021-10-15 ENCOUNTER — Other Ambulatory Visit: Payer: Self-pay | Admitting: Adult Health

## 2021-10-15 ENCOUNTER — Ambulatory Visit
Admission: RE | Admit: 2021-10-15 | Discharge: 2021-10-15 | Disposition: A | Discharge status: Home / Self Care | Payer: BC Managed Care – PPO | Source: Ambulatory Visit | Attending: Surgery | Admitting: Surgery

## 2021-10-15 ENCOUNTER — Ambulatory Visit
Admission: RE | Admit: 2021-10-15 | Discharge: 2021-10-15 | Disposition: A | Discharge status: Home / Self Care | Payer: Self-pay | Source: Ambulatory Visit | Attending: Adult Health | Admitting: Adult Health

## 2021-10-15 DIAGNOSIS — I728 Aneurysm of other specified arteries: Secondary | ICD-10-CM | POA: Diagnosis not present

## 2021-10-15 DIAGNOSIS — R310 Gross hematuria: Secondary | ICD-10-CM

## 2021-10-15 IMAGING — CT CT CTA ABD/PEL W/CM AND/OR W/O CM
2 of 3 series · 13 of 32 positions shown, 18 images · IV contrast (APPLIED)
Comparison: CT abdomen pelvis [DATE]

CLINICAL DATA: Splenic artery aneurysm

EXAM:
CTA ABDOMEN AND PELVIS WITHOUT AND WITH CONTRAST
TECHNIQUE: Multidetector CT imaging of the abdomen and pelvis was performed
using the standard protocol during bolus administration of
intravenous contrast. Multiplanar reconstructed images and MIPs were
obtained and reviewed to evaluate the vascular anatomy.

[Series 4: mesenteric angio · axial · 0.79mm/px · z∈[-544,-176]mm · 7 of 277 slices shown]
[im 31/277  soft-tissue]
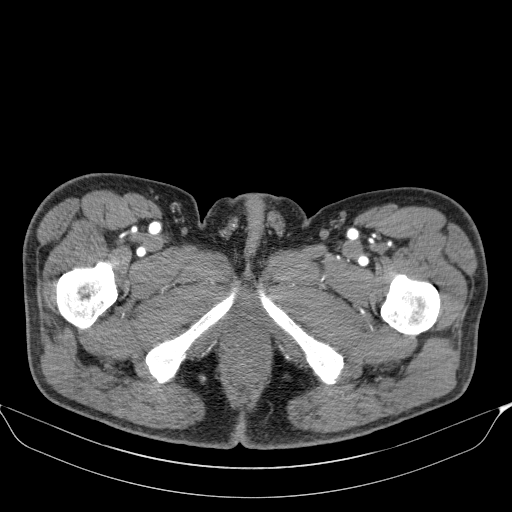
[im 62/277  soft-tissue]
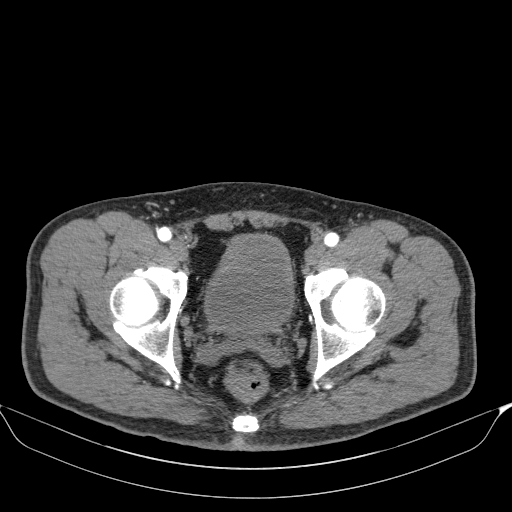
[im 93/277  soft-tissue]
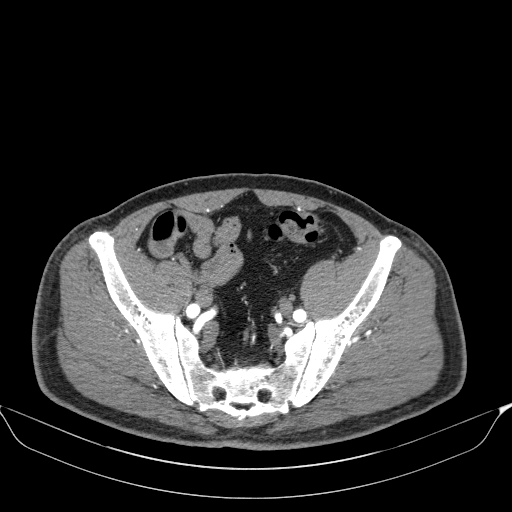
[im 123/277  soft-tissue]
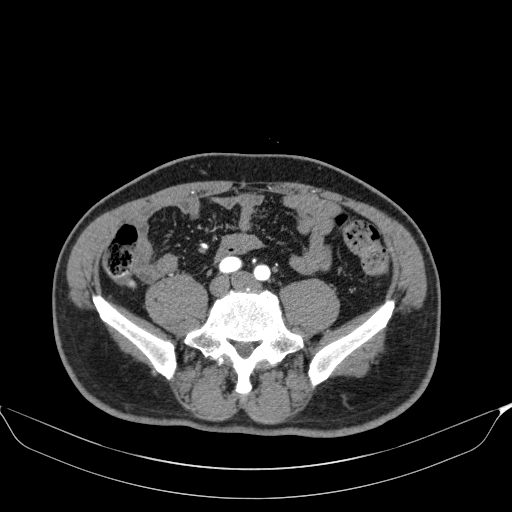
[im 154/277  soft-tissue]
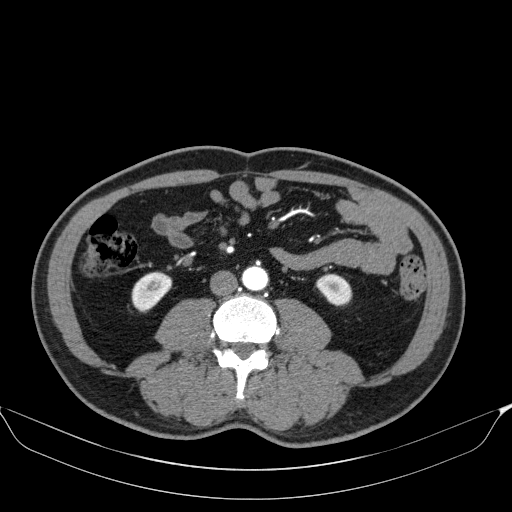
[im 185/277  soft-tissue]
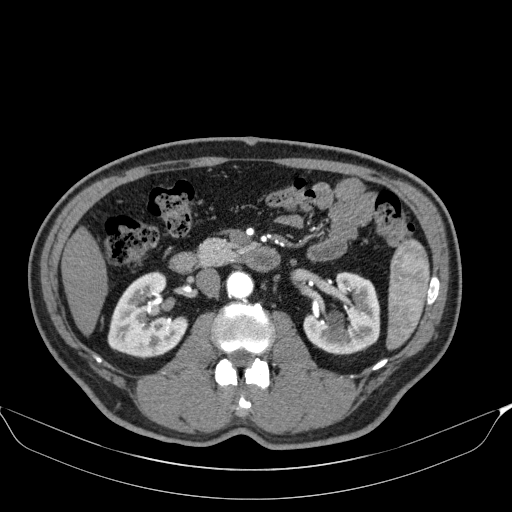
[im 215/277  soft-tissue]
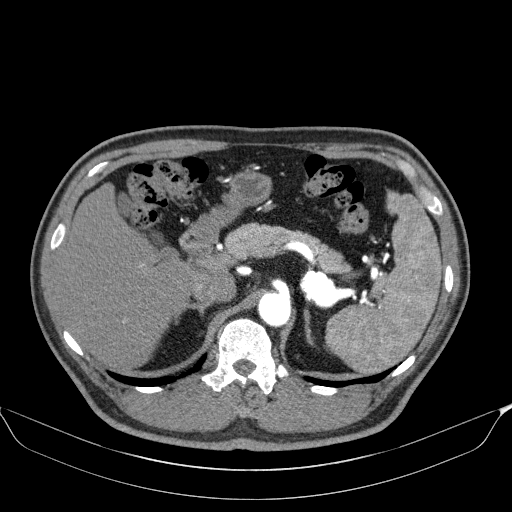

[Series 5: venous · axial · portal-venous · 0.79mm/px · z∈[-527,-132]mm · 6 of 111 slices shown, 11 images]
[im 16/111  soft-tissue]
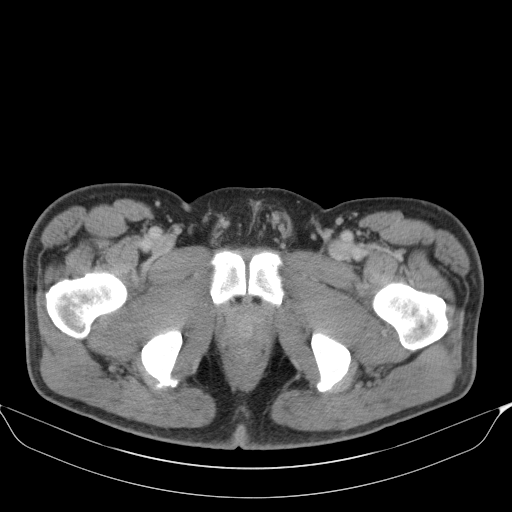
[im 16/111  bone]
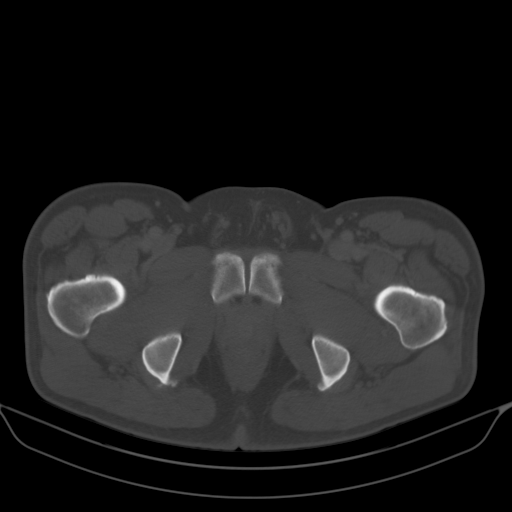
[im 32/111  soft-tissue]
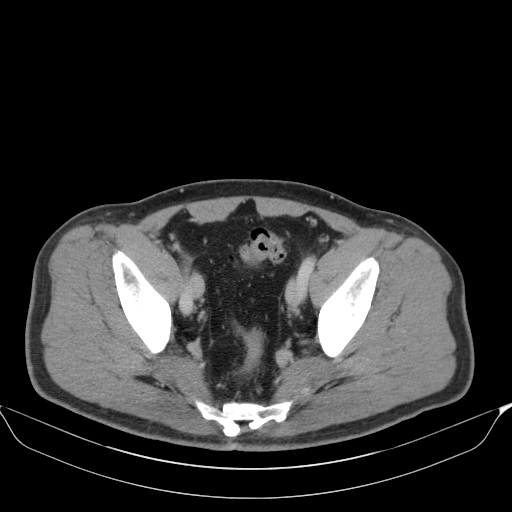
[im 48/111  soft-tissue]
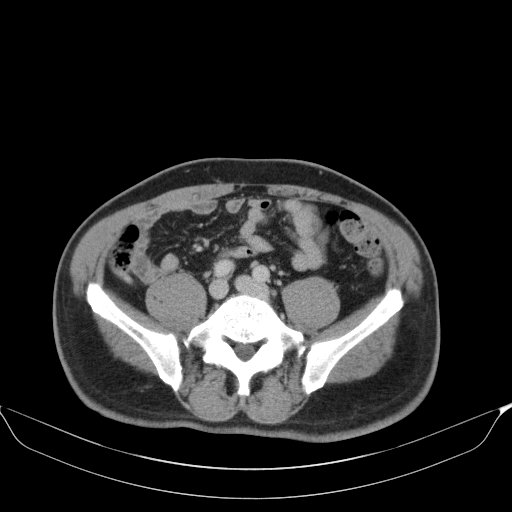
[im 48/111  lung]
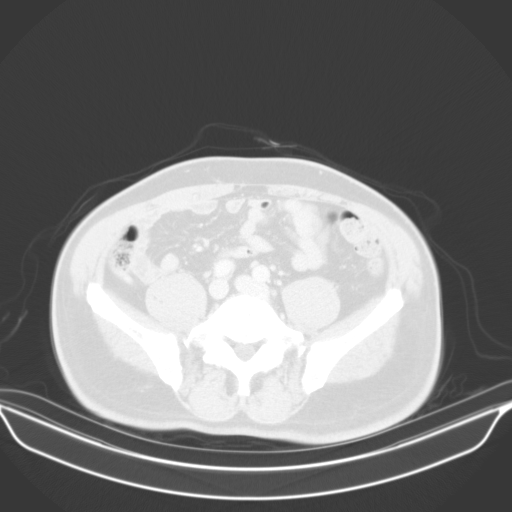
[im 63/111  soft-tissue]
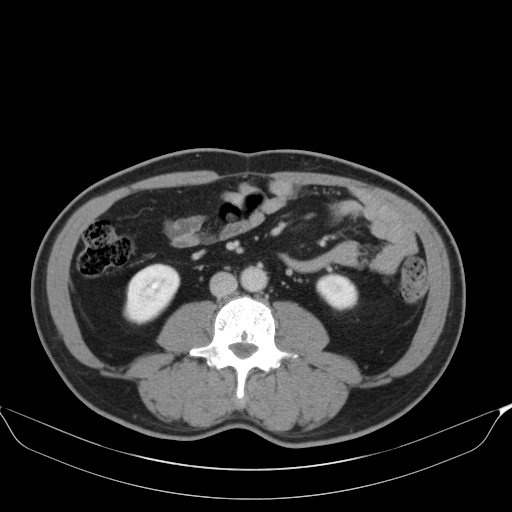
[im 63/111  lung]
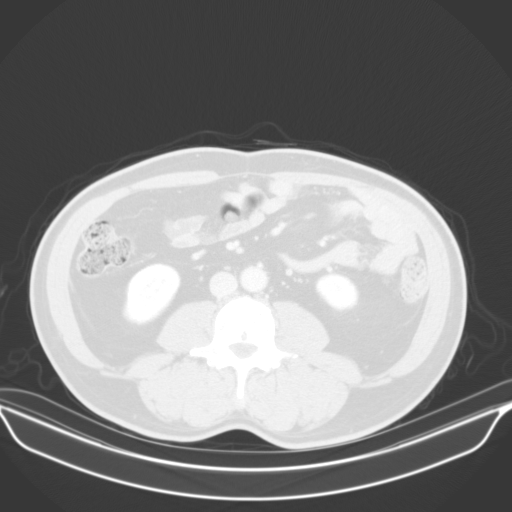
[im 79/111  soft-tissue]
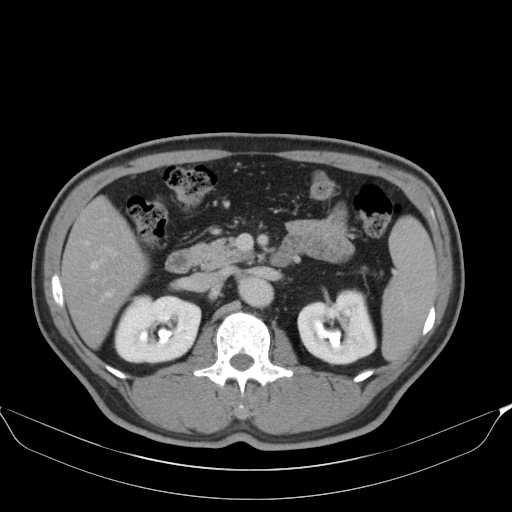
[im 79/111  lung]
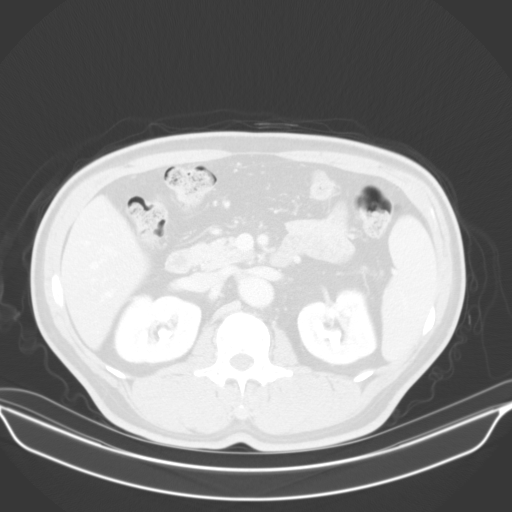
[im 95/111  soft-tissue]
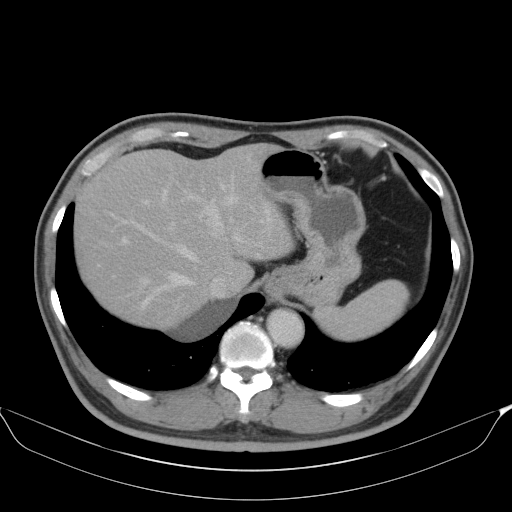
[im 95/111  lung]
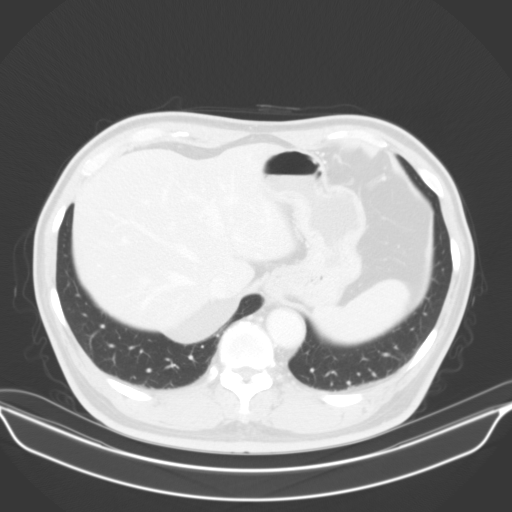

[13 of 32 positions shown; findings below may reference images not displayed]

RADIATION DOSE REDUCTION: This exam was performed according to the
departmental dose-optimization program which includes automated
exposure control, adjustment of the mA and/or kV according to
patient size and/or use of iterative reconstruction technique.

CONTRAST:  100mL [VS] IOPAMIDOL ([VS]) INJECTION 76%
FINDINGS: VASCULAR

Aorta: Normal caliber aorta without aneurysm, dissection, vasculitis
or significant stenosis.

Celiac: The celiac artery is patent with conventional branching
anatomy. There is a large partially peripherally calcified saccular
aneurysm of the mid to distal splenic artery that measures up to
x 3.5 x 3.2 cm.

SMA: Patent without evidence of aneurysm, dissection, vasculitis or
significant stenosis.

IMA: Patent.

Renals: Both renal arteries are patent without evidence of aneurysm,
dissection, vasculitis, fibromuscular dysplasia or significant
stenosis. Single renal arteries bilaterally, with early branching of
the left renal artery noted.

Inflow: Patent without evidence of aneurysm, dissection, vasculitis
or significant stenosis.

Veins: Visualized systemic veins and the portal venous system is
patent.

NON-VASCULAR

Inferior chest: The lung bases are well-aerated. Again seen is a
well-circumscribed area of fluid density in the inferior right
hemithorax, favored benign such as a pericardial cyst. Continue
previous imaging recommendation.

Hepatobiliary: The liver is normal in size without focal
abnormality. No intrahepatic or extrahepatic biliary ductal
dilation. The gallbladder appears normal.

Spleen: Normal in size without focal abnormality.

Pancreas: No pancreatic ductal dilatation or surrounding
inflammatory changes.

Adrenals/Urinary Tract: Adrenal glands are unremarkable. The kidneys
are normal in size without hydronephrosis. There are small
subcentimeter bilateral hypodense lesions which are too small to
definitively characterize, but favored benign renal cysts. Bladder
is unremarkable.

Stomach/Bowel: The stomach, small bowel and large bowel are normal
in caliber without abnormal wall thickening or surrounding
inflammatory changes. The appendix is normal.

Reproductive: The prostate gland appears enlarged.

Lymphatic: No enlarged lymph nodes in the abdomen or pelvis.

Other: No abdominopelvic ascites.

Musculoskeletal: No aggressive osseous lesions. The soft tissues are
unremarkable.
IMPRESSION: VASCULAR

1. Again seen is a saccular aneurysm of the mid to distal splenic
artery that measures 4.1 x 3.5 x 3.2 cm. Referral to vascular
specialist for treatment is recommended.

NON-VASCULAR

1. No additional acute findings in the abdomen or pelvis.

## 2021-10-15 MED ORDER — IOPAMIDOL (ISOVUE-370) INJECTION 76%
100.0000 mL | Freq: Once | INTRAVENOUS | Status: AC | PRN
  Administered 2021-10-15: 100 mL via INTRAVENOUS

## 2021-10-25 ENCOUNTER — Ambulatory Visit (INDEPENDENT_AMBULATORY_CARE_PROVIDER_SITE_OTHER): Payer: BC Managed Care – PPO | Admitting: Surgery

## 2021-10-25 ENCOUNTER — Encounter: Payer: Self-pay | Admitting: Surgery

## 2021-10-25 VITALS — BP 125/85 | HR 72 | Temp 98.4°F | Resp 20 | Ht 70.0 in | Wt 197.0 lb

## 2021-10-25 DIAGNOSIS — I728 Aneurysm of other specified arteries: Secondary | ICD-10-CM

## 2021-10-25 NOTE — Progress Notes (Signed)
Vascular and Vein Specialist of [facility]  Patient name: John Patrick MRN: [id number] DOB: 1961/05/21 Sex: male   REASON FOR VISIT:    Follow up  HISOTRY OF PRESENT ILLNESS:    John Patrick is a 61 y.o. 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  He is not having any abdominal pain.  He recently underwent treatment for bladder cancer and is recovering from that.  His other medical issues include hypertension.  He is a non-smoker.  I sent him for a CT scan to better define his aneurysm.  He is back today to discuss these results.   PAST MEDICAL HISTORY:   Past Medical History:  Diagnosis Date   Bladder cancer [facility])    urologist-- dr Louis Meckel   BPH (benign prostatic hyperplasia)    H/O acute prostatitis    Hypertension    Wears glasses      FAMILY HISTORY:   History reviewed. No pertinent family history.  SOCIAL HISTORY:   Social History   Tobacco Use   Smoking status: Never   Smokeless tobacco: Never  Substance Use Topics   Alcohol use: Not Currently     ALLERGIES:   No Known Allergies   CURRENT MEDICATIONS:   Current Outpatient Medications  Medication Sig Dispense Refill   losartan-hydrochlorothiazide (HYZAAR) 50-12.5 MG tablet Take 1 tablet by mouth daily.     tamsulosin (FLOMAX) 0.4 MG CAPS capsule Take 0.4 mg by mouth every other day.     No current facility-administered medications for this visit.    REVIEW OF SYSTEMS:   '[X]'$  denotes positive finding, '[ ]'$  denotes negative finding Cardiac  Comments:  Chest pain or chest pressure:    Shortness of breath upon exertion:    Short of breath when lying flat:    Irregular heart rhythm:        Vascular    Pain in calf, thigh, or hip brought on by ambulation:    Pain in feet at night that wakes you up from your sleep:     Blood clot in your veins:    Leg swelling:         Pulmonary    Oxygen at home:    Productive cough:      Wheezing:         Neurologic    Sudden weakness in arms or legs:     Sudden numbness in arms or legs:     Sudden onset of difficulty speaking or slurred speech:    Temporary loss of vision in one eye:     Problems with dizziness:         Gastrointestinal    Blood in stool:     Vomited blood:         Genitourinary    Burning when urinating:     Blood in urine:        Psychiatric    Major depression:         Hematologic    Bleeding problems:    Problems with blood clotting too easily:        Skin    Rashes or ulcers:        Constitutional    Fever or chills:      PHYSICAL EXAM:   Vitals:   10/25/21 1115  BP: 125/85  Pulse: 72  Resp: 20  Temp: 98.4 F (36.9 C)  SpO2: 95%  Weight: 197 lb (89.4 kg)  Height: '5\' 10"'$  (1.778 m)  GENERAL: The patient is a well-nourished male, in no acute distress. The vital signs are documented above. CARDIAC: There is a regular rate and rhythm.  MUSCULOSKELETAL: There are no major deformities or cyanosis. NEUROLOGIC: No focal weakness or paresthesias are detected. SKIN: There are no ulcers or rashes noted. PSYCHIATRIC: The patient has a normal affect.  STUDIES:   I have reviewed his CT scan with the following findings: 1. Again seen is a saccular aneurysm of the mid to distal splenic artery that measures 4.1 x 3.5 x 3.2 cm. Referral to vascular specialist for treatment is recommended.   NON-VASCULAR   1. No additional acute findings in the abdomen or pelvis.   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  The second would be embolization.  I discussed the details of each procedure including the risks and benefits.  We talked about proceeding with preprocedural vaccination.  I will plan on waiting until after the case to determine whether or not he needs to be vaccinated.  The only reason to vaccinate him would  be if I embolized the splenic artery.  This is been scheduled for June 20    [name], MD, FACS [facility]ascular and Vein Specialists of [facility] [phone] Pager [phone]

## 2021-10-25 NOTE — H&P (View-Only) (Signed)
Vascular and Vein Specialist of [facility]  Patient name: John Patrick MRN: [id number] DOB: [birth year] Sex: male   REASON FOR VISIT:    Follow up  HISOTRY OF PRESENT ILLNESS:    John Patrick is a 61 y.o. 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  He is not having any abdominal pain.  He recently underwent treatment for bladder cancer and is recovering from that.  His other medical issues include hypertension.  He is a non-smoker.  I sent him for a CT scan to better define his aneurysm.  He is back today to discuss these results.   PAST MEDICAL HISTORY:   Past Medical History:  Diagnosis Date   Bladder cancer [facility])    urologist-- dr Louis Meckel   BPH (benign prostatic hyperplasia)    H/O acute prostatitis    Hypertension    Wears glasses      FAMILY HISTORY:   History reviewed. No pertinent family history.  SOCIAL HISTORY:   Social History   Tobacco Use   Smoking status: Never   Smokeless tobacco: Never  Substance Use Topics   Alcohol use: Not Currently     ALLERGIES:   No Known Allergies   CURRENT MEDICATIONS:   Current Outpatient Medications  Medication Sig Dispense Refill   losartan-hydrochlorothiazide (HYZAAR) 50-12.5 MG tablet Take 1 tablet by mouth daily.     tamsulosin (FLOMAX) 0.4 MG CAPS capsule Take 0.4 mg by mouth every other day.     No current facility-administered medications for this visit.    REVIEW OF SYSTEMS:   '[X]'$  denotes positive finding, '[ ]'$  denotes negative finding Cardiac  Comments:  Chest pain or chest pressure:    Shortness of breath upon exertion:    Short of breath when lying flat:    Irregular heart rhythm:        Vascular    Pain in calf, thigh, or hip brought on by ambulation:    Pain in feet at night that wakes you up from your sleep:     Blood clot in your veins:    Leg swelling:         Pulmonary    Oxygen at home:    Productive cough:      Wheezing:         Neurologic    Sudden weakness in arms or legs:     Sudden numbness in arms or legs:     Sudden onset of difficulty speaking or slurred speech:    Temporary loss of vision in one eye:     Problems with dizziness:         Gastrointestinal    Blood in stool:     Vomited blood:         Genitourinary    Burning when urinating:     Blood in urine:        Psychiatric    Major depression:         Hematologic    Bleeding problems:    Problems with blood clotting too easily:        Skin    Rashes or ulcers:        Constitutional    Fever or chills:      PHYSICAL EXAM:   Vitals:   10/25/21 1115  BP: 125/85  Pulse: 72  Resp: 20  Temp: 98.4 F (36.9 C)  SpO2: 95%  Weight: 197 lb (89.4 kg)  Height: '5\' 10"'$  (1.778 m)  GENERAL: The patient is a well-nourished male, in no acute distress. The vital signs are documented above. CARDIAC: There is a regular rate and rhythm.  MUSCULOSKELETAL: There are no major deformities or cyanosis. NEUROLOGIC: No focal weakness or paresthesias are detected. SKIN: There are no ulcers or rashes noted. PSYCHIATRIC: The patient has a normal affect.  STUDIES:   I have reviewed his CT scan with the following findings: 1. Again seen is a saccular aneurysm of the mid to distal splenic artery that measures 4.1 x 3.5 x 3.2 cm. Referral to vascular specialist for treatment is recommended.   NON-VASCULAR   1. No additional acute findings in the abdomen or pelvis.   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  The second would be embolization.  I discussed the details of each procedure including the risks and benefits.  We talked about proceeding with preprocedural vaccination.  I will plan on waiting until after the case to determine whether or not he needs to be vaccinated.  The only reason to vaccinate him would  be if I embolized the splenic artery.  This is been scheduled for June 20    [name], MD, FACS Vascular and Vein Specialists of [facility] [phone] Pager [phone]

## 2021-10-26 ENCOUNTER — Other Ambulatory Visit: Payer: Self-pay

## 2021-10-26 DIAGNOSIS — I728 Aneurysm of other specified arteries: Secondary | ICD-10-CM

## 2021-11-09 ENCOUNTER — Encounter (HOSPITAL_COMMUNITY)
Admission: RE | Disposition: A | Discharge status: Home / Self Care | Payer: Self-pay | Source: Home / Self Care | Attending: Surgery

## 2021-11-09 ENCOUNTER — Encounter (HOSPITAL_COMMUNITY): Payer: Self-pay | Admitting: Surgery

## 2021-11-09 ENCOUNTER — Other Ambulatory Visit: Payer: Self-pay

## 2021-11-09 ENCOUNTER — Ambulatory Visit (HOSPITAL_COMMUNITY)
Admission: RE | Admit: 2021-11-09 | Discharge: 2021-11-09 | Disposition: A | Discharge status: Home / Self Care | Payer: BC Managed Care – PPO | Attending: Surgery | Admitting: Surgery

## 2021-11-09 DIAGNOSIS — I728 Aneurysm of other specified arteries: Secondary | ICD-10-CM | POA: Diagnosis not present

## 2021-11-09 DIAGNOSIS — I10 Essential (primary) hypertension: Secondary | ICD-10-CM | POA: Diagnosis not present

## 2021-11-09 DIAGNOSIS — Z8551 Personal history of malignant neoplasm of bladder: Secondary | ICD-10-CM | POA: Insufficient documentation

## 2021-11-09 DIAGNOSIS — I726 Aneurysm of vertebral artery: Secondary | ICD-10-CM | POA: Diagnosis not present

## 2021-11-09 HISTORY — PX: ABDOMINAL AORTOGRAM W/LOWER EXTREMITY: CATH118223

## 2021-11-09 HISTORY — PX: PERIPHERAL VASCULAR INTERVENTION: CATH118257

## 2021-11-09 LAB — POCT I-STAT, CHEM 8
BUN: 23 mg/dL (ref 8–23)
Calcium, Ion: 1.21 mmol/L (ref 1.15–1.40)
Chloride: 102 mmol/L (ref 98–111)
Creatinine, Ser: 1 mg/dL (ref 0.61–1.24)
Glucose, Bld: 126 mg/dL — ABNORMAL HIGH (ref 70–99)
HCT: 45 % (ref 39.0–52.0)
Hemoglobin: 15.3 g/dL (ref 13.0–17.0)
Potassium: 3.5 mmol/L (ref 3.5–5.1)
Sodium: 142 mmol/L (ref 135–145)
TCO2: 28 mmol/L (ref 22–32)

## 2021-11-09 LAB — POCT ACTIVATED CLOTTING TIME: Activated Clotting Time: 245 s

## 2021-11-09 SURGERY — ABDOMINAL AORTOGRAM W/LOWER EXTREMITY
Anesthesia: LOCAL

## 2021-11-09 MED ORDER — HYDRALAZINE HCL 20 MG/ML IJ SOLN: 5.0000 mg | INTRAMUSCULAR | Status: DC | PRN

## 2021-11-09 MED ORDER — CLOPIDOGREL BISULFATE 75 MG PO TABS: 75.0000 mg | ORAL_TABLET | Freq: Every day | ORAL | Status: DC

## 2021-11-09 MED ORDER — FENTANYL CITRATE (PF) 100 MCG/2ML IJ SOLN
INTRAMUSCULAR | Status: DC | PRN
  Administered 2021-11-09: 25 ug via INTRAVENOUS
  Administered 2021-11-09: 50 ug via INTRAVENOUS

## 2021-11-09 MED ORDER — FENTANYL CITRATE (PF) 100 MCG/2ML IJ SOLN
INTRAMUSCULAR | Status: AC
  Filled 2021-11-09: qty 2

## 2021-11-09 MED ORDER — HEPARIN (PORCINE) IN NACL 1000-0.9 UT/500ML-% IV SOLN
INTRAVENOUS | Status: DC | PRN
  Administered 2021-11-09 (×2): 500 mL

## 2021-11-09 MED ORDER — LIDOCAINE HCL (PF) 1 % IJ SOLN
INTRAMUSCULAR | Status: AC
  Filled 2021-11-09: qty 30

## 2021-11-09 MED ORDER — CLOPIDOGREL BISULFATE 75 MG PO TABS: 75.0000 mg | ORAL_TABLET | Freq: Every day | ORAL | 11 refills | Status: DC

## 2021-11-09 MED ORDER — ACETAMINOPHEN 325 MG PO TABS
650.0000 mg | ORAL_TABLET | ORAL | Status: DC | PRN
Start: 2021-11-09 — End: 2021-11-09

## 2021-11-09 MED ORDER — LABETALOL HCL 5 MG/ML IV SOLN: 10.0000 mg | INTRAVENOUS | Status: DC | PRN

## 2021-11-09 MED ORDER — HEPARIN SODIUM (PORCINE) 1000 UNIT/ML IJ SOLN
INTRAMUSCULAR | Status: AC
  Filled 2021-11-09: qty 10

## 2021-11-09 MED ORDER — ASPIRIN 81 MG PO TBEC: 81.0000 mg | DELAYED_RELEASE_TABLET | Freq: Every day | ORAL | 2 refills | Status: AC

## 2021-11-09 MED ORDER — MIDAZOLAM HCL 2 MG/2ML IJ SOLN
INTRAMUSCULAR | Status: AC
  Filled 2021-11-09: qty 2

## 2021-11-09 MED ORDER — ONDANSETRON HCL 4 MG/2ML IJ SOLN: 4.0000 mg | Freq: Four times a day (QID) | INTRAMUSCULAR | Status: DC | PRN

## 2021-11-09 MED ORDER — HEPARIN SODIUM (PORCINE) 1000 UNIT/ML IJ SOLN
INTRAMUSCULAR | Status: DC | PRN
  Administered 2021-11-09: 9000 [IU] via INTRAVENOUS

## 2021-11-09 MED ORDER — HEPARIN (PORCINE) IN NACL 1000-0.9 UT/500ML-% IV SOLN
INTRAVENOUS | Status: AC
  Filled 2021-11-09: qty 1000

## 2021-11-09 MED ORDER — SODIUM CHLORIDE 0.9% FLUSH: 3.0000 mL | Freq: Two times a day (BID) | INTRAVENOUS | Status: DC

## 2021-11-09 MED ORDER — SODIUM CHLORIDE 0.9 % IV SOLN: 250.0000 mL | INTRAVENOUS | Status: DC | PRN

## 2021-11-09 MED ORDER — CLOPIDOGREL BISULFATE 75 MG PO TABS
ORAL_TABLET | ORAL | Status: AC
  Filled 2021-11-09: qty 1

## 2021-11-09 MED ORDER — SODIUM CHLORIDE 0.9 % IV SOLN: INTRAVENOUS | Status: DC

## 2021-11-09 MED ORDER — ASPIRIN 81 MG PO TBEC
81.0000 mg | DELAYED_RELEASE_TABLET | Freq: Every day | ORAL | Status: DC
  Administered 2021-11-09: 81 mg via ORAL
  Filled 2021-11-09: qty 1

## 2021-11-09 MED ORDER — MIDAZOLAM HCL 2 MG/2ML IJ SOLN
INTRAMUSCULAR | Status: DC | PRN
  Administered 2021-11-09: 2 mg via INTRAVENOUS
  Administered 2021-11-09: 1 mg via INTRAVENOUS

## 2021-11-09 MED ORDER — IODIXANOL 320 MG/ML IV SOLN
INTRAVENOUS | Status: DC | PRN
  Administered 2021-11-09: 100 mL

## 2021-11-09 MED ORDER — LIDOCAINE HCL (PF) 1 % IJ SOLN
INTRAMUSCULAR | Status: DC | PRN
  Administered 2021-11-09: 15 mL

## 2021-11-09 MED ORDER — SODIUM CHLORIDE 0.9 % WEIGHT BASED INFUSION: 1.0000 mL/kg/h | INTRAVENOUS | Status: DC

## 2021-11-09 MED ORDER — SODIUM CHLORIDE 0.9% FLUSH: 3.0000 mL | INTRAVENOUS | Status: DC | PRN

## 2021-11-09 MED ORDER — CLOPIDOGREL BISULFATE 300 MG PO TABS
ORAL_TABLET | ORAL | Status: DC | PRN
  Administered 2021-11-09: 75 mg via ORAL

## 2021-11-09 SURGICAL SUPPLY — 22 items
BALLN STERLING OTW 7X60X135 (BALLOONS) ×3
BALLOON STERLING OTW 7X60X135 (BALLOONS) IMPLANT
CATH ANGIO 5F BER 100CM (CATHETERS) ×1 IMPLANT
CATH OMNI FLUSH 5F 65CM (CATHETERS) ×1 IMPLANT
CATH QUICKCROSS SUPP .035X90CM (MICROCATHETER) ×1 IMPLANT
DEVICE TORQUE H2O (MISCELLANEOUS) ×1 IMPLANT
DEVICE VASC CLSR CELT ART 7 (Vascular Products) ×1 IMPLANT
GUIDEWIRE ANGLED .035X150CM (WIRE) ×1 IMPLANT
KIT ENCORE 26 ADVANTAGE (KITS) ×1 IMPLANT
KIT MICROPUNCTURE NIT STIFF (SHEATH) ×1 IMPLANT
KIT PV (KITS) ×4 IMPLANT
SHEATH HIGHFLEX ANSEL 7FR 55CM (SHEATH) ×1 IMPLANT
SHEATH PINNACLE 5F 10CM (SHEATH) ×1 IMPLANT
SHEATH PINNACLE 7F 10CM (SHEATH) ×1 IMPLANT
SHEATH PROBE COVER 6X72 (BAG) ×1 IMPLANT
STENT VIABAHN 8X7.5X120 (Permanent Stent) ×1 IMPLANT
SYR MEDRAD MARK V 150ML (SYRINGE) ×1 IMPLANT
TRANSDUCER W/STOPCOCK (MISCELLANEOUS) ×4 IMPLANT
TRAY PV CATH (CUSTOM PROCEDURE TRAY) ×4 IMPLANT
WIRE BENTSON .035X145CM (WIRE) ×1 IMPLANT
WIRE G V18X300CM (WIRE) ×1 IMPLANT
WIRE ROSEN-J .035X260CM (WIRE) ×1 IMPLANT

## 2021-11-09 NOTE — Progress Notes (Signed)
Up and walked and tolerated well; right groin stable, no bleeding or hematoma 

## 2021-11-09 NOTE — Interval H&P Note (Signed)
History and Physical Interval Note:  11/09/2021 7:28 AM  John Patrick  has presented today for surgery, with the diagnosis of aneurysm of artery.  The various methods of treatment have been discussed with the patient and family. After consideration of risks, benefits and other options for treatment, the patient has consented to  Procedure(s): ABDOMINAL AORTOGRAM W/LOWER EXTREMITY (N/A) as a surgical intervention.  The patient's history has been reviewed, patient examined, no change in status, stable for surgery.  I have reviewed the patient's chart and labs.  Questions were answered to the patient's satisfaction.     Annamarie Major

## 2021-11-09 NOTE — Op Note (Signed)
    Patient name: Tonny Isensee MRN: [id number] DOB: 03-09-1961 Sex: male  11/09/2021 Pre-operative Diagnosis: Splenic artery aneurysm Post-operative diagnosis:  Same Surgeon:  Annamarie Major Procedure Performed:  1.  Ultrasound-guided access, right femoral artery  2.  Abdominal aortogram  3.  Endovascular pair of splenic artery aneurysm  4.  Conscious sedation, 68 minutes  5.  Closure device, Celt    Indications: This is a 61 year old gentleman with a approximate 4 cm splenic artery aneurysm he comes in today for endovascular repair.  Procedure:  The patient was identified in the holding area and taken to room 8.  The patient was then placed supine on the table and prepped and draped in the usual sterile fashion.  A time out was called.  Conscious sedation was administered with the use of IV fentanyl and Versed under continuous physician and nurse monitoring.  Heart rate, blood pressure, and oxygen saturation were continuously monitored.  Total sedation time was 68 minutes.  Ultrasound was used to evaluate the right common femoral artery.  It was patent .  A digital ultrasound image was acquired.  A micropuncture needle was used to access the right common femoral artery under ultrasound guidance.  An 018 wire was advanced without resistance and a micropuncture sheath was placed.  The 018 wire was removed and a benson wire was placed.  The micropuncture sheath was exchanged for a 5 french sheath.  An omniflush catheter was advanced over the wire to the level of T12 and an abdominal aortogram was performed in the lateral projection.  Next I used the Omni Flush catheter and a Glidewire to cannulate the splenic artery.  The Omni Flush catheter was then removed and a quick cross catheter was then used to navigate the wire out into the splenic aneurysm.  I then placed a Rosen wire.  A 7 French 55 cm high flex Ansell 1 sheath was then advanced out into the mid splenic artery.  The patient was then fully  heparinized.  Next I used a Berenstein catheter and Glidewire to get to the distal splenic artery.  I then advanced the sheath as far as it would go which was into the aneurysm.  I then placed a V-18 wire into the distal splenic artery.  Next, a Viabahn 8 x 7.5 stent was positioned across the aneurysm and then deployed.  It was postdilated with a 7 mm balloon.  Completion imaging showed resolution of the aneurysm.  The long sheath was exchanged out for a short 7 and a Celt was used for closure.  Impression:  #1  Successful endovascular repair of splenic artery aneurysm using a Viabahn 8 x 7.5  #2  The patient will be on dual antiplatelet therapy for 1 month and then transition to just an aspirin.  He will not need to be on a statin   V. Annamarie Major, M.D., [facility] Vascular and Vein Specialists of [place] Office: [phone] Pager:  [phone]

## 2021-11-12 ENCOUNTER — Telehealth: Payer: Self-pay

## 2021-11-26 NOTE — Telephone Encounter (Signed)
Appt scheduled

## 2021-11-29 DIAGNOSIS — Z1211 Encounter for screening for malignant neoplasm of colon: Secondary | ICD-10-CM | POA: Diagnosis not present

## 2021-12-06 ENCOUNTER — Other Ambulatory Visit: Payer: Self-pay | Admitting: *Deleted

## 2021-12-06 DIAGNOSIS — I728 Aneurysm of other specified arteries: Secondary | ICD-10-CM

## 2021-12-27 ENCOUNTER — Encounter: Payer: Self-pay | Admitting: Surgery

## 2021-12-27 ENCOUNTER — Ambulatory Visit (INDEPENDENT_AMBULATORY_CARE_PROVIDER_SITE_OTHER): Payer: BC Managed Care – PPO | Admitting: Surgery

## 2021-12-27 ENCOUNTER — Ambulatory Visit (HOSPITAL_COMMUNITY)
Admission: RE | Admit: 2021-12-27 | Discharge: 2021-12-27 | Disposition: A | Discharge status: Home / Self Care | Payer: BC Managed Care – PPO | Source: Ambulatory Visit | Attending: Surgery | Admitting: Surgery

## 2021-12-27 VITALS — BP 125/88 | HR 64 | Temp 98.3°F | Resp 20 | Ht 70.0 in | Wt 199.0 lb

## 2021-12-27 DIAGNOSIS — I728 Aneurysm of other specified arteries: Secondary | ICD-10-CM | POA: Diagnosis not present

## 2021-12-27 NOTE — Progress Notes (Signed)
Vascular and Vein Specialist of [facility]  Patient name: John Patrick MRN: [id number] DOB: Jun 03, 1960 Sex: male   REASON FOR VISIT:    Follow up  HISOTRY OF PRESENT ILLNESS:    John Patrick is a 61 y.o. 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 He underwent endovascular repair with stenting on 11-09-2021   He recently underwent treatment for bladder cancer and is recovering from that.  His other medical issues include hypertension.  He is a non-smoke  PAST MEDICAL HISTORY:   Past Medical History:  Diagnosis Date   Bladder cancer John Patrick ([place]))    urologist-- dr John Patrick   BPH (benign prostatic hyperplasia)    H/O acute prostatitis    Hypertension    Wears glasses      FAMILY HISTORY:   History reviewed. No pertinent family history.  SOCIAL HISTORY:   Social History   Tobacco Use   Smoking status: Never   Smokeless tobacco: Never  Substance Use Topics   Alcohol use: Not Currently     ALLERGIES:   No Known Allergies   CURRENT MEDICATIONS:   Current Outpatient Medications  Medication Sig Dispense Refill   aspirin EC 81 MG tablet Take 1 tablet (81 mg total) by mouth daily. Swallow whole. 150 tablet 2   clopidogrel (PLAVIX) 75 MG tablet Take 1 tablet (75 mg total) by mouth daily. 30 tablet 11   losartan-hydrochlorothiazide (HYZAAR) 50-12.5 MG tablet Take 1 tablet by mouth daily.     Multiple Vitamin (MULTIVITAMIN WITH MINERALS) TABS tablet Take 1 tablet by mouth 3 (three) times a week.     tamsulosin (FLOMAX) 0.4 MG CAPS capsule Take 0.4 mg by mouth every other day.     No current facility-administered medications for this visit.    REVIEW OF SYSTEMS:   '[X]'$  denotes positive finding, '[ ]'$  denotes negative finding Cardiac  Comments:  Chest pain or chest pressure:    Shortness of breath upon exertion:    Short of breath when lying flat:    Irregular heart rhythm:        Vascular    Pain in calf, thigh, or hip brought on by  ambulation:    Pain in feet at night that wakes you up from your sleep:     Blood clot in your veins:    Leg swelling:         Pulmonary    Oxygen at home:    Productive cough:     Wheezing:         Neurologic    Sudden weakness in arms or legs:     Sudden numbness in arms or legs:     Sudden onset of difficulty speaking or slurred speech:    Temporary loss of vision in one eye:     Problems with dizziness:         Gastrointestinal    Blood in stool:     Vomited blood:         Genitourinary    Burning when urinating:     Blood in urine:        Psychiatric    Major depression:         Hematologic    Bleeding problems:    Problems with blood clotting too easily:        Skin    Rashes or ulcers:        Constitutional    Fever or chills:      PHYSICAL EXAM:   Vitals:  12/27/21 0917  BP: 125/88  Pulse: 64  Resp: 20  Temp: 98.3 F (36.8 C)  SpO2: 97%  Weight: 199 lb (90.3 kg)  Height: '5\' 10"'$  (1.778 m)    GENERAL: The patient is a well-nourished male, in no acute distress. The vital signs are documented above. CARDIAC: There is a regular rate and rhythm.  VASCULAR: Right groin cannulation site without hematoma or ecchymosis PULMONARY: Non-labored respirations ABDOMEN: Soft and non-tender with MUSCULOSKELETAL: There are no major deformities or cyanosis. NEUROLOGIC: No focal weakness or paresthesias are detected. SKIN: There are no ulcers or rashes noted. PSYCHIATRIC: The patient has a normal affect.  STUDIES:   I have reviewed the following ultrasound: Mesenteric:  Normal Celiac artery and Superior Mesenteric artery findings.     The splenic artery appears closed with no color flow or Doppler signal  measuring  approximately 3.3 x 3.6 cm   MEDICAL ISSUES:    Status post splenic artery aneurysm repair with covered stenting: Ultrasound shows successful exclusion of the aneurysm.  The patient will continue Plavix until his prescription runs out and  then be maintained on 81 mg aspirin indefinitely   John Alf, MD, FACS Vascular and Vein Specialists of [facility] [phone] Pager [phone]

## 2021-12-31 ENCOUNTER — Other Ambulatory Visit: Payer: Self-pay

## 2021-12-31 DIAGNOSIS — I728 Aneurysm of other specified arteries: Secondary | ICD-10-CM

## 2022-01-06 DIAGNOSIS — Z01818 Encounter for other preprocedural examination: Secondary | ICD-10-CM | POA: Diagnosis not present

## 2022-01-11 NOTE — Progress Notes (Unsigned)
Cardiology Office Note:    Date:  01/12/2022   ID:  John Patrick, DOB 22-Jun-1960, MRN [id number]  PCP:  Vernie Shanks, MD (Inactive)  Cardiologist:  None  Electrophysiologist:  None   Referring MD: No ref. provider found   Chief Complaint  Patient presents with   Palpitations    History of Present Illness:    John Patrick is a 61 y.o. male with a hx of bladder cancer, hypertension, splenic artery aneurysm status post endovascular repair with stenting 11/09/2021 who is referred for evaluation of palpitations.  He reports has been having irregular heart rhythms, notes on his BP monitor.  When he feels pulse, it feels irregular.  Also has been having recent episodes of chest pain.  Describes as pressure on chest, has not noted clear relationship with exertion.  Denies any recent lightheadedness or syncope, reports 1 syncopal episode 15 years ago.  Denies any lower extremity edema.  No smoking history.  Family history includes father had ICD in 66s, but he does not know details.  He works as a Financial planner.   Past Medical History:  Diagnosis Date   Bladder cancer [facility])    urologist-- dr Louis Meckel   BPH (benign prostatic hyperplasia)    H/O acute prostatitis    Hypertension    Wears glasses     Past Surgical History:  Procedure Laterality Date   ABDOMINAL AORTOGRAM W/LOWER EXTREMITY N/A 11/09/2021   Procedure: ABDOMINAL AORTOGRAM W/LOWER EXTREMITY;  Surgeon: Serafina Mitchell, MD;  Location: [place] [facility];  Service: Cardiovascular;  Laterality: N/A;   NO PAST SURGERIES     PERIPHERAL VASCULAR INTERVENTION  11/09/2021   Procedure: PERIPHERAL VASCULAR INTERVENTION;  Surgeon: Serafina Mitchell, MD;  Location: [place] [facility];  Service: Cardiovascular;;  Splenic Artery   TRANSURETHRAL RESECTION OF BLADDER TUMOR WITH MITOMYCIN-C Bilateral 09/10/2021   Procedure: TRANSURETHRAL RESECTION OF BLADDER TUMOR WITH POST OPERATIVE INSTILLATION OF GEMCITABINE BILATERAL RETROGRADE PYELOGRAM;   Surgeon: Ardis Hughs, MD;  Location: [facility];  Service: Urology;  Laterality: Bilateral;    Current Medications: Current Meds  Medication Sig   aspirin EC 81 MG tablet Take 1 tablet (81 mg total) by mouth daily. Swallow whole.   losartan-hydrochlorothiazide (HYZAAR) 50-12.5 MG tablet Take 1 tablet by mouth daily.   metoprolol tartrate (LOPRESSOR) 100 MG tablet Take 1 tablet ('100mg'$ ) TWO hours prior to CT scan   Multiple Vitamin (MULTIVITAMIN WITH MINERALS) TABS tablet Take 1 tablet by mouth 3 (three) times a week.   tamsulosin (FLOMAX) 0.4 MG CAPS capsule Take 0.4 mg by mouth every other day.     Allergies:   Patient has no known allergies.   Social History   Socioeconomic History   Marital status: Married    Spouse name: Not on file   Number of children: Not on file   Years of education: Not on file   Highest education level: Not on file  Occupational History   Not on file  Tobacco Use   Smoking status: Never   Smokeless tobacco: Never  Vaping Use   Vaping Use: Never used  Substance and Sexual Activity   Alcohol use: Not Currently   Drug use: Never   Sexual activity: Not on file  Other Topics Concern   Not on file  Social History Narrative   Not on file   Social Determinants of Health   Financial Resource Strain: Not on file  Food Insecurity: Not on file  Transportation Needs:  Not on file  Physical Activity: Not on file  Stress: Not on file  Social Connections: Not on file     Family History: Family history includes father had ICD in 13s, but he does not know details.  ROS:   Please see the history of present illness.     All other systems reviewed and are negative.  EKGs/Labs/Other Studies Reviewed:    The following studies were reviewed today:   EKG:   01/12/2022: Normal sinus rhythm, nonspecific T wave flattening, rate 83  Recent Labs: 11/09/2021: BUN 23; Creatinine, Ser 1.00; Hemoglobin 15.3; Potassium 3.5; Sodium 142   Recent Lipid Panel No results found for: "CHOL", "TRIG", "HDL", "CHOLHDL", "VLDL", "LDLCALC", "LDLDIRECT"  Physical Exam:    VS:  BP 128/78   Pulse 83   Ht '5\' 10"'$  (1.778 m)   Wt 202 lb 9.6 oz (91.9 kg)   SpO2 95%   BMI 29.07 kg/m     Wt Readings from Last 3 Encounters:  01/12/22 202 lb 9.6 oz (91.9 kg)  12/27/21 199 lb (90.3 kg)  11/09/21 195 lb (88.5 kg)     GEN:  Well nourished, well developed in no acute distress HEENT: Normal NECK: No JVD; No carotid bruits LYMPHATICS: No lymphadenopathy CARDIAC: RRR, no murmurs, rubs, gallops RESPIRATORY:  Clear to auscultation without rales, wheezing or rhonchi  ABDOMEN: Soft, non-tender, non-distended MUSCULOSKELETAL:  No edema; No deformity  SKIN: Warm and dry NEUROLOGIC:  Alert and oriented x 3 PSYCHIATRIC:  Normal affect   ASSESSMENT:    1. Chest pain of uncertain etiology   2. Irregular heart beat   3. Hyperlipidemia, unspecified hyperlipidemia type   4. Essential hypertension   5. Splenic artery aneurysm [facility])    PLAN:    Chest pain: Atypical in description but does have significant CAD risk factors (age, hypertension, hyperlipidemia). -Recommend coronary CTA to evaluate for obstructive CAD.  Will give Lopressor 100 mg prior to study -Echocardiogram  Palpitations/irregular heart rhythm: Description concerning for arrhythmia, evaluate with Zio patch x2 weeks  Splenic artery aneurysm: status post endovascular repair with stenting 11/09/2021.  Follows with Dr. Trula Slade in vascular surgery -Continue aspirin 81 mg daily  Hypertension: On losartan-HCTZ 50-12.5 mg daily.  Appears controlled  Hyperlipidemia: LDL 118 12/2020.  Check lipid panel.  Follow-up results of coronary CTA to guide how aggressive to be in lowering cholesterol.  RTC in 4 months   Medication Adjustments/Labs and Tests Ordered: Current medicines are reviewed at length with the patient today.  Concerns regarding medicines are outlined above.  Orders  Placed This Encounter  Procedures   CT CORONARY MORPH W/CTA COR W/SCORE W/CA W/CM &/OR WO/CM   Basic metabolic panel   Lipid panel   EKG 12-Lead   ECHOCARDIOGRAM COMPLETE   Meds ordered this encounter  Medications   metoprolol tartrate (LOPRESSOR) 100 MG tablet    Sig: Take 1 tablet ('100mg'$ ) TWO hours prior to CT scan    Dispense:  1 tablet    Refill:  0    Patient Instructions  Medication Instructions:  Your physician recommends that you continue on your current medications as directed. Please refer to the Current Medication list given to you today.  Take metoprolol (Lopressor) 100 mg TWO hours prior to CT scan  *If you need a refill on your cardiac medications before your next appointment, please call your pharmacy*   Lab Work: BMET, Lipid today  If you have labs (blood work) drawn today and your tests are completely normal,  you will receive your results only by: [place] (if you have MyChart) OR A paper copy in the mail If you have any lab test that is abnormal or we need to change your treatment, we will call you to review the results.   Testing/Procedures: Your physician has requested that you have an echocardiogram. Echocardiography is a painless test that uses sound waves to create images of your heart. It provides your doctor with information about the size and shape of your heart and how well your heart's chambers and valves are working. This procedure takes approximately one hour. There are no restrictions for this procedure.  Coronary CTA-see instructions below  ZIO XT- Long Term Monitor Instructions   Your physician has requested you wear a ZIO patch monitor for _14_ days.  This is a single patch monitor.   [employer] supplies one patch monitor per enrollment. Additional stickers are not available. Please do not apply patch if you will be having a Nuclear Stress Test, Echocardiogram, Cardiac CT, MRI, or Chest Xray during the period you would be wearing the  monitor. The patch cannot be worn during these tests. You cannot remove and re-apply the ZIO XT patch monitor.  Your ZIO patch monitor will be sent Fed Ex from [employer] directly to your home address. It may take 3-5 days to receive your monitor after you have been enrolled.  Once you have received your monitor, please review the enclosed instructions. Your monitor has already been registered assigning a specific monitor serial # to you.  Billing and Patient Assistance Program Information   We have supplied [employer] with any of your insurance information on file for billing purposes. [employer] offers a sliding scale Patient Assistance Program for patients that do not have insurance, or whose insurance does not completely cover the cost of the ZIO monitor.   You must apply for the Patient Assistance Program to qualify for this discounted rate.     To apply, please call [employer] at [phone], select option 4, then select option 2, and ask to apply for Patient Assistance Program.  Theodore Demark will ask your household income, and how many people are in your household.  They will quote your out-of-pocket cost based on that information.  [employer] will also be able to set up a 86-month interest-free payment plan if needed.  Applying the monitor   Shave hair from upper left chest.  Hold abrader disc by orange tab. Rub abrader in 40 strokes over the upper left chest as indicated in your monitor instructions.  Clean area with 4 enclosed alcohol pads. Let dry.  Apply patch as indicated in monitor instructions. Patch will be placed under collarbone on left side of chest with arrow pointing upward.  Rub patch adhesive wings for 2 minutes. Remove white label marked "1". Remove the white label marked "2". Rub patch adhesive wings for 2 additional minutes.  While looking in a mirror, press and release button in center of patch. A small green light will flash 3-4 times. This will be your only indicator that the  monitor has been turned on. ?  Do not shower for the first 24 hours. You may shower after the first 24 hours.  Press the button if you feel a symptom. You will hear a small click. Record Date, Time and Symptom in the Patient Logbook.  When you are ready to remove the patch, follow instructions on the last 2 pages of the Patient Logbook. Stick patch monitor onto the last page of  Patient Logbook.  Place Patient Logbook in the blue and white box.  Use locking tab on box and tape box closed securely.  The blue and white box has prepaid postage on it. Please place it in the mailbox as soon as possible. Your physician should have your test results approximately 7 days after the monitor has been mailed back to [facility].  Call [place] at [phone] if you have questions regarding your ZIO XT patch monitor. Call them immediately if you see an orange light blinking on your monitor.  If your monitor falls off in less than 4 days, contact our Monitor department at [phone]. ?If your monitor becomes loose or falls off after 4 days call [employer] at [phone] for suggestions on securing your monitor.?  Follow-Up: At [facility], you and your health needs are our priority.  As part of our continuing mission to provide you with exceptional heart care, we have created designated Provider Care Teams.  These Care Teams include your primary Cardiologist (physician) and Advanced Practice Providers (APPs -  Physician Assistants and Nurse Practitioners) who all work together to provide you with the care you need, when you need it.  We recommend signing up for the patient portal called "MyChart".  Sign up information is provided on this After Visit Summary.  MyChart is used to connect with patients for Virtual Visits (Telemedicine).  Patients are able to view lab/test results, encounter notes, upcoming appointments, etc.  Non-urgent messages can be sent to your provider as well.   To  learn more about what you can do with MyChart, go to [email or web address].    Your next appointment:   December with Dr. Gardiner Rhyme    Other Instructions   Your cardiac CT will be scheduled at one of the below locations:   [facility] [address] [place], [place] [address] [phone]   If scheduled at [facility], please arrive at the [facility] and Children's Entrance (Entrance C2) of [facility] 30 minutes prior to test start time. You can use the FREE valet parking offered at entrance C (encouraged to control the heart rate for the test)  Proceed to the [facility] Radiology Department (first floor) to check-in and test prep.  All radiology patients and guests should use entrance C2 at [facility], accessed from [facility], even though the hospital's physical address listed is [address].     Please follow these instructions carefully (unless otherwise directed):  Hold all erectile dysfunction medications at least 3 days (72 hrs) prior to test.  On the Night Before the Test: Be sure to Drink plenty of water. Do not consume any caffeinated/decaffeinated beverages or chocolate 12 hours prior to your test. Do not take any antihistamines 12 hours prior to your test.  On the Day of the Test: Drink plenty of water until 1 hour prior to the test. Do not eat any food 4 hours prior to the test. You may take your regular medications prior to the test.  Take metoprolol (Lopressor) two hours prior to test. HOLD losartan/Hydrochlorothiazide morning of the test.  After the Test: Drink plenty of water. After receiving IV contrast, you may experience a mild flushed feeling. This is normal. On occasion, you may experience a mild rash up to 24 hours after the test. This is not dangerous. If this occurs, you can take Benadryl 25 mg and increase your fluid intake. If you experience trouble breathing, this can be  serious. If  it is severe call 911 IMMEDIATELY. If it is mild, please call our office. If you take any of these medications: Glipizide/Metformin, Avandament, Glucavance, please do not take 48 hours after completing test unless otherwise instructed.  We will call to schedule your test 2-4 weeks out understanding that some insurance companies will need an authorization prior to the service being performed.   For non-scheduling related questions, please contact the cardiac imaging nurse navigator should you have any questions/concerns: Marchia Bond, Cardiac Imaging Nurse Navigator Gordy Clement, Cardiac Imaging Nurse Navigator [facility] Direct Office Dial: [phone]   For scheduling needs, including cancellations and rescheduling, please call [place], [phone].           Signed, Donato Heinz, MD  01/12/2022 5:53 PM    [place]

## 2022-01-12 ENCOUNTER — Ambulatory Visit: Payer: BC Managed Care – PPO

## 2022-01-12 ENCOUNTER — Other Ambulatory Visit: Payer: Self-pay | Admitting: Cardiology

## 2022-01-12 ENCOUNTER — Encounter: Payer: Self-pay | Admitting: Cardiology

## 2022-01-12 ENCOUNTER — Ambulatory Visit (INDEPENDENT_AMBULATORY_CARE_PROVIDER_SITE_OTHER): Payer: BC Managed Care – PPO | Admitting: Cardiology

## 2022-01-12 VITALS — BP 128/78 | HR 83 | Ht 70.0 in | Wt 202.6 lb

## 2022-01-12 DIAGNOSIS — I10 Essential (primary) hypertension: Secondary | ICD-10-CM | POA: Diagnosis not present

## 2022-01-12 DIAGNOSIS — I499 Cardiac arrhythmia, unspecified: Secondary | ICD-10-CM | POA: Diagnosis not present

## 2022-01-12 DIAGNOSIS — I728 Aneurysm of other specified arteries: Secondary | ICD-10-CM

## 2022-01-12 DIAGNOSIS — R079 Chest pain, unspecified: Secondary | ICD-10-CM | POA: Diagnosis not present

## 2022-01-12 DIAGNOSIS — R002 Palpitations: Secondary | ICD-10-CM

## 2022-01-12 DIAGNOSIS — E785 Hyperlipidemia, unspecified: Secondary | ICD-10-CM

## 2022-01-12 MED ORDER — METOPROLOL TARTRATE 100 MG PO TABS: ORAL_TABLET | ORAL | 0 refills | Status: DC

## 2022-01-12 NOTE — Patient Instructions (Signed)
Medication Instructions:  Your physician recommends that you continue on your current medications as directed. Please refer to the Current Medication list given to you today.  Take metoprolol (Lopressor) 100 mg TWO hours prior to CT scan  *If you need a refill on your cardiac medications before your next appointment, please call your pharmacy*   Lab Work: BMET, Lipid today  If you have labs (blood work) drawn today and your tests are completely normal, you will receive your results only by: [place] (if you have MyChart) OR A paper copy in the mail If you have any lab test that is abnormal or we need to change your treatment, we will call you to review the results.   Testing/Procedures: Your physician has requested that you have an echocardiogram. Echocardiography is a painless test that uses sound waves to create images of your heart. It provides your doctor with information about the size and shape of your heart and how well your heart's chambers and valves are working. This procedure takes approximately one hour. There are no restrictions for this procedure.  Coronary CTA-see instructions below  ZIO XT- Long Term Monitor Instructions   Your physician has requested you wear a ZIO patch monitor for _14_ days.  This is a single patch monitor.   [employer] supplies one patch monitor per enrollment. Additional stickers are not available. Please do not apply patch if you will be having a Nuclear Stress Test, Echocardiogram, Cardiac CT, MRI, or Chest Xray during the period you would be wearing the monitor. The patch cannot be worn during these tests. You cannot remove and re-apply the ZIO XT patch monitor.  Your ZIO patch monitor will be sent Fed Ex from [employer] directly to your home address. It may take 3-5 days to receive your monitor after you have been enrolled.  Once you have received your monitor, please review the enclosed instructions. Your monitor has already been  registered assigning a specific monitor serial # to you.  Billing and Patient Assistance Program Information   We have supplied [employer] with any of your insurance information on file for billing purposes. [employer] offers a sliding scale Patient Assistance Program for patients that do not have insurance, or whose insurance does not completely cover the cost of the ZIO monitor.   You must apply for the Patient Assistance Program to qualify for this discounted rate.     To apply, please call [employer] at [phone], select option 4, then select option 2, and ask to apply for Patient Assistance Program.  Theodore Demark will ask your household income, and how many people are in your household.  They will quote your out-of-pocket cost based on that information.  [employer] will also be able to set up a 57-month interest-free payment plan if needed.  Applying the monitor   Shave hair from upper left chest.  Hold abrader disc by orange tab. Rub abrader in 40 strokes over the upper left chest as indicated in your monitor instructions.  Clean area with 4 enclosed alcohol pads. Let dry.  Apply patch as indicated in monitor instructions. Patch will be placed under collarbone on left side of chest with arrow pointing upward.  Rub patch adhesive wings for 2 minutes. Remove white label marked "1". Remove the white label marked "2". Rub patch adhesive wings for 2 additional minutes.  While looking in a mirror, press and release button in center of patch. A small green light will flash 3-4 times. This will be your only indicator  that the monitor has been turned on. ?  Do not shower for the first 24 hours. You may shower after the first 24 hours.  Press the button if you feel a symptom. You will hear a small click. Record Date, Time and Symptom in the Patient Logbook.  When you are ready to remove the patch, follow instructions on the last 2 pages of the Patient Logbook. Stick patch monitor onto the last page of Patient  Logbook.  Place Patient Logbook in the blue and white box.  Use locking tab on box and tape box closed securely.  The blue and white box has prepaid postage on it. Please place it in the mailbox as soon as possible. Your physician should have your test results approximately 7 days after the monitor has been mailed back to [facility].  Call [place] at [phone] if you have questions regarding your ZIO XT patch monitor. Call them immediately if you see an orange light blinking on your monitor.  If your monitor falls off in less than 4 days, contact our Monitor department at [phone]. ?If your monitor becomes loose or falls off after 4 days call [employer] at [phone] for suggestions on securing your monitor.?  Follow-Up: At [facility], you and your health needs are our priority.  As part of our continuing mission to provide you with exceptional heart care, we have created designated Provider Care Teams.  These Care Teams include your primary Cardiologist (physician) and Advanced Practice Providers (APPs -  Physician Assistants and Nurse Practitioners) who all work together to provide you with the care you need, when you need it.  We recommend signing up for the patient portal called "MyChart".  Sign up information is provided on this After Visit Summary.  MyChart is used to connect with patients for Virtual Visits (Telemedicine).  Patients are able to view lab/test results, encounter notes, upcoming appointments, etc.  Non-urgent messages can be sent to your provider as well.   To learn more about what you can do with MyChart, go to [email or web address].    Your next appointment:   December with Dr. Gardiner Rhyme    Other Instructions   Your cardiac CT will be scheduled at one of the below locations:   [facility] [address] [place], [place] [address] [phone]   If scheduled at [facility], please arrive at the [facility] and  Children's Entrance (Entrance C2) of [facility] 30 minutes prior to test start time. You can use the FREE valet parking offered at entrance C (encouraged to control the heart rate for the test)  Proceed to the [facility] Radiology Department (first floor) to check-in and test prep.  All radiology patients and guests should use entrance C2 at [facility], accessed from [facility], even though the hospital's physical address listed is [address].     Please follow these instructions carefully (unless otherwise directed):  Hold all erectile dysfunction medications at least 3 days (72 hrs) prior to test.  On the Night Before the Test: Be sure to Drink plenty of water. Do not consume any caffeinated/decaffeinated beverages or chocolate 12 hours prior to your test. Do not take any antihistamines 12 hours prior to your test.  On the Day of the Test: Drink plenty of water until 1 hour prior to the test. Do not eat any food 4 hours prior to the test. You may take your regular medications prior to the test.  Take metoprolol (Lopressor) two hours prior to test. HOLD losartan/Hydrochlorothiazide morning of the test.  After the Test: Drink plenty of water. After receiving IV contrast, you may experience a mild flushed feeling. This is normal. On occasion, you may experience a mild rash up to 24 hours after the test. This is not dangerous. If this occurs, you can take Benadryl 25 mg and increase your fluid intake. If you experience trouble breathing, this can be serious. If it is severe call 911 IMMEDIATELY. If it is mild, please call our office. If you take any of these medications: Glipizide/Metformin, Avandament, Glucavance, please do not take 48 hours after completing test unless otherwise instructed.  We will call to schedule your test 2-4 weeks out understanding that some insurance companies will need an authorization prior to the service being  performed.   For non-scheduling related questions, please contact the cardiac imaging nurse navigator should you have any questions/concerns: Marchia Bond, Cardiac Imaging Nurse Navigator Gordy Clement, Cardiac Imaging Nurse Navigator [facility] Direct Office Dial: [phone]   For scheduling needs, including cancellations and rescheduling, please call [place], [phone].

## 2022-01-12 NOTE — Progress Notes (Unsigned)
Enrolled for [employer] to mail a ZIO XT long term holter monitor to the patients address on file.  

## 2022-01-13 LAB — BASIC METABOLIC PANEL
BUN/Creatinine Ratio: 16 (ref 10–24)
BUN: 16 mg/dL (ref 8–27)
CO2: 20 mmol/L (ref 20–29)
Calcium: 9.3 mg/dL (ref 8.6–10.2)
Chloride: 101 mmol/L (ref 96–106)
Creatinine, Ser: 0.97 mg/dL (ref 0.76–1.27)
Glucose: 148 mg/dL — ABNORMAL HIGH (ref 70–99)
Potassium: 3.8 mmol/L (ref 3.5–5.2)
Sodium: 140 mmol/L (ref 134–144)
eGFR: 88 mL/min/{1.73_m2} (ref >59)

## 2022-01-13 LAB — LIPID PANEL
Chol/HDL Ratio: 5.8 ratio — ABNORMAL HIGH (ref 0.0–5.0)
Cholesterol, Total: 161 mg/dL (ref 100–199)
HDL: 28 mg/dL — ABNORMAL LOW (ref >39)
LDL Chol Calc (NIH): 84 mg/dL (ref 0–99)
Triglycerides: 296 mg/dL — ABNORMAL HIGH (ref 0–149)
VLDL Cholesterol Cal: 49 mg/dL — ABNORMAL HIGH (ref 5–40)

## 2022-01-28 ENCOUNTER — Ambulatory Visit (HOSPITAL_COMMUNITY): Payer: BC Managed Care – PPO | Attending: Cardiology

## 2022-01-28 DIAGNOSIS — I5030 Unspecified diastolic (congestive) heart failure: Secondary | ICD-10-CM | POA: Diagnosis not present

## 2022-01-28 DIAGNOSIS — I5189 Other ill-defined heart diseases: Secondary | ICD-10-CM | POA: Diagnosis not present

## 2022-01-28 DIAGNOSIS — I517 Cardiomegaly: Secondary | ICD-10-CM

## 2022-01-28 DIAGNOSIS — I77810 Thoracic aortic ectasia: Secondary | ICD-10-CM | POA: Diagnosis not present

## 2022-01-28 DIAGNOSIS — R079 Chest pain, unspecified: Secondary | ICD-10-CM | POA: Diagnosis not present

## 2022-01-29 LAB — ECHOCARDIOGRAM COMPLETE
Area-P 1/2: 3.99 cm2
S' Lateral: 3.4 cm

## 2022-01-31 NOTE — Progress Notes (Signed)
Fax received for medical clearance/medication hold for Dr. Trula Slade.  Provider signed, form faxed back to sender, verified successful.

## 2022-02-03 DIAGNOSIS — Z6828 Body mass index (BMI) 28.0-28.9, adult: Secondary | ICD-10-CM | POA: Diagnosis not present

## 2022-02-03 DIAGNOSIS — U071 COVID-19: Secondary | ICD-10-CM | POA: Diagnosis not present

## 2022-02-16 ENCOUNTER — Telehealth (HOSPITAL_COMMUNITY): Payer: Self-pay | Admitting: Emergency Medicine

## 2022-02-16 NOTE — Telephone Encounter (Signed)
Attempted to call patient regarding upcoming cardiac CT appointment. °Left message on voicemail with name and callback number °Davidlee Jeanbaptiste RN Navigator Cardiac Imaging °[facility] °[phone] Office °[phone] Cell ° °

## 2022-02-17 ENCOUNTER — Telehealth (HOSPITAL_COMMUNITY): Payer: Self-pay | Admitting: *Deleted

## 2022-02-17 NOTE — Telephone Encounter (Signed)
Patient returning call regarding upcoming cardiac imaging study; pt verbalizes understanding of appt date/time, parking situation and where to check in, medications ordered, and verified current allergies; name and call back number provided for further questions should they arise  Gordy Clement RN Navigator Cardiac Imaging [facility] [phone] office [phone] cell  Patient to hold his BP medication and take '100mg'$  metoprolol tartrate two hours prior to his cardiac CT scan.  He is aware to arrive at 2:30pm.

## 2022-02-18 ENCOUNTER — Ambulatory Visit (HOSPITAL_COMMUNITY)
Admission: RE | Admit: 2022-02-18 | Discharge: 2022-02-18 | Disposition: A | Discharge status: Home / Self Care | Payer: BC Managed Care – PPO | Source: Ambulatory Visit | Attending: Cardiology | Admitting: Cardiology

## 2022-02-18 DIAGNOSIS — R079 Chest pain, unspecified: Secondary | ICD-10-CM | POA: Diagnosis not present

## 2022-02-18 MED ORDER — NITROGLYCERIN 0.4 MG SL SUBL
0.8000 mg | SUBLINGUAL_TABLET | Freq: Once | SUBLINGUAL | Status: AC
  Administered 2022-02-18: 0.8 mg via SUBLINGUAL

## 2022-02-18 MED ORDER — NITROGLYCERIN 0.4 MG SL SUBL
SUBLINGUAL_TABLET | SUBLINGUAL | Status: AC
  Filled 2022-02-18: qty 2

## 2022-02-18 MED ORDER — IOHEXOL 350 MG/ML SOLN
100.0000 mL | Freq: Once | INTRAVENOUS | Status: AC | PRN
  Administered 2022-02-18: 100 mL via INTRAVENOUS

## 2022-02-22 ENCOUNTER — Other Ambulatory Visit: Payer: Self-pay | Admitting: *Deleted

## 2022-02-22 DIAGNOSIS — R002 Palpitations: Secondary | ICD-10-CM | POA: Diagnosis not present

## 2022-02-22 DIAGNOSIS — J90 Pleural effusion, not elsewhere classified: Secondary | ICD-10-CM

## 2022-02-22 DIAGNOSIS — I499 Cardiac arrhythmia, unspecified: Secondary | ICD-10-CM | POA: Diagnosis not present

## 2022-03-11 ENCOUNTER — Ambulatory Visit (INDEPENDENT_AMBULATORY_CARE_PROVIDER_SITE_OTHER): Payer: BC Managed Care – PPO | Admitting: Internal Medicine

## 2022-03-11 ENCOUNTER — Encounter: Payer: Self-pay | Admitting: Internal Medicine

## 2022-03-11 VITALS — BP 110/80 | HR 95 | Ht 70.0 in | Wt 197.4 lb

## 2022-03-11 DIAGNOSIS — R9389 Abnormal findings on diagnostic imaging of other specified body structures: Secondary | ICD-10-CM | POA: Diagnosis not present

## 2022-03-11 NOTE — Progress Notes (Signed)
John Patrick    [id number]    [birth year]  Primary Care Physician:Wong, Edwyna Shell, MD (Inactive)  Referring Physician: Donato Heinz, [address] [place],  [place] [address] Reason for Consultation: abnormal ct chest Date of Consultation: 03/11/2022  Chief complaint:   Chief Complaint  Patient presents with   Consult    Abnormal CT scan, Dry cough     HPI: Elijah Phommachanh is a 61 y.o. man who presents for evaluation for abnormal ct chest.   He says his cough does not actually bother him and he just said that to get the referral to pulmonary. He notes that when he had the ct scan of his chest to evaluate his heart, there was incidental findings of ground glass opacities. He also mentions that he has a fluid collection in his right posterior lung base abutting the pericardium that has been stable there for 20 years.  He denies fevers, chills, weight loss, hemoptysis.  He was concerned because his mother had lung cancer (who was a smoker.) and his brother was also getting a ct scan with concern for abnormalities about this.   At the time of his CT scan he was just getting over a URI which was felt to be covid infection. His symptoms related to this - cough, shortness of breath, are now resolved.   Social history:  Smoking history: never smoker, passive smoke exposure in mother.   Social History   Occupational History   Not on file  Tobacco Use   Smoking status: Never   Smokeless tobacco: Never  Vaping Use   Vaping Use: Never used  Substance and Sexual Activity   Alcohol use: Not Currently   Drug use: Never   Sexual activity: Not on file    Relevant family history:  Family History  Problem Relation Age of Onset   Lung cancer Mother     Past Medical History:  Diagnosis Date   Bladder cancer [facility])    urologist-- dr Louis Meckel   BPH (benign prostatic hyperplasia)    H/O acute prostatitis    Hypertension    Wears glasses     Past  Surgical History:  Procedure Laterality Date   ABDOMINAL AORTOGRAM W/LOWER EXTREMITY N/A 11/09/2021   Procedure: ABDOMINAL AORTOGRAM W/LOWER EXTREMITY;  Surgeon: Serafina Mitchell, MD;  Location: [place] [facility];  Service: Cardiovascular;  Laterality: N/A;   NO PAST SURGERIES     PERIPHERAL VASCULAR INTERVENTION  11/09/2021   Procedure: PERIPHERAL VASCULAR INTERVENTION;  Surgeon: Serafina Mitchell, MD;  Location: [place] [facility];  Service: Cardiovascular;;  Splenic Artery   TRANSURETHRAL RESECTION OF BLADDER TUMOR WITH MITOMYCIN-C Bilateral 09/10/2021   Procedure: TRANSURETHRAL RESECTION OF BLADDER TUMOR WITH POST OPERATIVE INSTILLATION OF GEMCITABINE BILATERAL RETROGRADE PYELOGRAM;  Surgeon: Ardis Hughs, MD;  Location: [facility];  Service: Urology;  Laterality: Bilateral;     Physical Exam: Blood pressure 110/80, pulse 95, height '5\' 10"'$  (1.778 m), weight 197 lb 6.4 oz (89.5 kg), SpO2 96 %. Gen:      No acute distress ENT:  no nasal polyps, mucus membranes moist Lungs:    No increased respiratory effort, symmetric chest wall excursion, clear to auscultation bilaterally, no wheezes or crackles CV:         Regular rate and rhythm; no murmurs, rubs, or gallops.  No pedal edema Abd:      + bowel sounds; soft, non-tender; no distension MSK:  no acute synovitis of DIP or PIP joints, no mechanics hands.  Skin:      Warm and dry; no rashes Neuro: normal speech, no focal facial asymmetry Psych: alert and oriented x3, normal mood and affect   Data Reviewed/Medical Decision Making:  Independent interpretation of tests: Imaging:  Review of patient's CT Cardiac Score images revealed faint ground glass. The patient's images have been independently reviewed by me.    PFTs:  Labs:  Lab Results  Component Value Date   HGB 15.3 11/09/2021   HCT 45.0 11/09/2021   Lab Results  Component Value Date   NA 140 01/12/2022   K 3.8 01/12/2022   CL 101 01/12/2022   CO2 20  01/12/2022      Immunization status:   There is no immunization history on file for this patient.   I reviewed prior external note(s) from cardiology  I reviewed the result(s) of the labs and imaging as noted above.   I have ordered    Assessment:  Abnormal ct chest Loculated fluid collection, right hemithorax Scattered ground glass opacities  Plan/Recommendations:  You had some changes on your CT scan that looked at your heart that looked consistent with a recent respiratory illness (ground glass opacities.) These changes were minor and do not need any further follow up since you are feeling better.   You have a small fluid collection in your chest on the right side that it sounds like has been stable for many years. No further follow up is needed on this. 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   No evidence of lung cancer based on the limited portions of the lung that were visualized. Due to no personal history of smoking, no indication for LDCT for lung cancer screening. w  I spent 45 minutes in the care of this patient today including pre-charting, chart review, review of results, face-to-face care, coordination of care and communication with consultants etc.).   Return to Care: Return if symptoms worsen or fail to improve.  Lenice Llamas, MD Pulmonary and [place]  CC: Donato Heinz*

## 2022-03-11 NOTE — Patient Instructions (Signed)
Come back and see me as needed.  You had some changes on your CT scan that looked at your heart that looked consistent with a recent respiratory illness. These changes were minor and do not need any further follow up since you are feeling better.   You have a small fluid collection in your chest on the right side that it sounds like has been stable for many years. No further follow up is needed on this.   No evidence of lung cancer

## 2022-03-17 ENCOUNTER — Ambulatory Visit: Payer: BC Managed Care – PPO | Attending: Cardiology | Admitting: Cardiology

## 2022-03-17 ENCOUNTER — Encounter: Payer: Self-pay | Admitting: Cardiology

## 2022-03-17 VITALS — BP 126/80 | HR 78 | Ht 70.0 in | Wt 198.6 lb

## 2022-03-17 DIAGNOSIS — I77819 Aortic ectasia, unspecified site: Secondary | ICD-10-CM

## 2022-03-17 DIAGNOSIS — E785 Hyperlipidemia, unspecified: Secondary | ICD-10-CM

## 2022-03-17 DIAGNOSIS — I10 Essential (primary) hypertension: Secondary | ICD-10-CM

## 2022-03-17 DIAGNOSIS — R002 Palpitations: Secondary | ICD-10-CM

## 2022-03-17 DIAGNOSIS — R079 Chest pain, unspecified: Secondary | ICD-10-CM | POA: Diagnosis not present

## 2022-03-17 NOTE — Patient Instructions (Signed)
Medication Instructions:  Your physician recommends that you continue on your current medications as directed. Please refer to the Current Medication list given to you today.  *If you need a refill on your cardiac medications before your next appointment, please call your pharmacy*  Testing/Procedures: Your physician has requested that you have an echocardiogram in 1 YEAR. Echocardiography is a painless test that uses sound waves to create images of your heart. It provides your doctor with information about the size and shape of your heart and how well your heart's chambers and valves are working. This procedure takes approximately one hour. There are no restrictions for this procedure. Please do NOT wear cologne, perfume, aftershave, or lotions (deodorant is allowed). Please arrive 15 minutes prior to your appointment time. Follow-Up: At [facility], you and your health needs are our priority.  As part of our continuing mission to provide you with exceptional heart care, we have created designated Provider Care Teams.  These Care Teams include your primary Cardiologist (physician) and Advanced Practice Providers (APPs -  Physician Assistants and Nurse Practitioners) who all work together to provide you with the care you need, when you need it.  We recommend signing up for the patient portal called "MyChart".  Sign up information is provided on this After Visit Summary.  MyChart is used to connect with patients for Virtual Visits (Telemedicine).  Patients are able to view lab/test results, encounter notes, upcoming appointments, etc.  Non-urgent messages can be sent to your provider as well.   To learn more about what you can do with MyChart, go to [email or web address].    Your next appointment:   12 month(s)  The format for your next appointment:   In Person  Provider:   Dr. Gardiner Rhyme

## 2022-03-17 NOTE — Progress Notes (Signed)
Cardiology Office Note:    Date:  03/17/2022   ID:  John Patrick, DOB 09-Jun-1960, MRN [id number]  PCP:  Vernie Shanks, MD (Inactive)  Cardiologist:  None  Electrophysiologist:  None   Referring MD: No ref. provider found   Chief Complaint  Patient presents with   Chest Pain    History of Present Illness:    John Patrick is a 61 y.o. 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  He was initially seen on 01/12/2022, had been referred for evaluation of palpitations.  He reports has been having irregular heart rhythms, notes on his BP monitor.  When he feels pulse, it feels irregular.  Also has been having recent episodes of chest pain.  Describes as pressure on chest, has not noted clear relationship with exertion.  Denies any recent lightheadedness or syncope, reports 1 syncopal episode 15 years ago.  Denies any lower extremity edema.  No smoking history.  Family history includes father had ICD in 60s, but he does not know details.  He works as a Financial planner.  Echocardiogram 01/28/2022 showed EF 55 to 60%, mild anterior septal hypokinesis, normal RV function, no significant valvular disease, dilated aorta measuring 45 mm at aortic root and 42 mm and ascending aorta.  Coronary CTA on 02/18/2022 showed no CAD, calcium score 0, dilated aorta measuring 42 mm at aortic root and ascending aorta; also noted to have likely small loculated pleural fluid collection.  Zio patch x14 days on 02/24/2022 showed 1 episode of NSVT lasting 4 beats, 23 episodes of SVT with longest lasting 25 seconds, occasional PACs (2.0%) and occasional PVCs (2.7%).  Since last clinic visit, he reports that he is doing well.  Denies any palpitations.  Reports some lightheadedness with denies any syncope.  Denies any chest pain, dyspnea, or lower extremity edema.   Past Medical History:  Diagnosis Date   Bladder  cancer [facility])    urologist-- dr Louis Meckel   BPH (benign prostatic hyperplasia)    H/O acute prostatitis    Hypertension    Wears glasses     Past Surgical History:  Procedure Laterality Date   ABDOMINAL AORTOGRAM W/LOWER EXTREMITY N/A 11/09/2021   Procedure: ABDOMINAL AORTOGRAM W/LOWER EXTREMITY;  Surgeon: Serafina Mitchell, MD;  Location: [place] [facility];  Service: Cardiovascular;  Laterality: N/A;   NO PAST SURGERIES     PERIPHERAL VASCULAR INTERVENTION  11/09/2021   Procedure: PERIPHERAL VASCULAR INTERVENTION;  Surgeon: Serafina Mitchell, MD;  Location: [place] [facility];  Service: Cardiovascular;;  Splenic Artery   TRANSURETHRAL RESECTION OF BLADDER TUMOR WITH MITOMYCIN-C Bilateral 09/10/2021   Procedure: TRANSURETHRAL RESECTION OF BLADDER TUMOR WITH POST OPERATIVE INSTILLATION OF GEMCITABINE BILATERAL RETROGRADE PYELOGRAM;  Surgeon: Ardis Hughs, MD;  Location: [facility];  Service: Urology;  Laterality: Bilateral;    Current Medications: Current Meds  Medication Sig   aspirin EC 81 MG tablet Take 1 tablet (81 mg total) by mouth daily. Swallow whole.   losartan-hydrochlorothiazide (HYZAAR) 50-12.5 MG tablet Take 1 tablet by mouth daily.   Multiple Vitamin (MULTIVITAMIN WITH MINERALS) TABS tablet Take 1 tablet by mouth 3 (three) times a week.   tamsulosin (FLOMAX) 0.4 MG CAPS capsule Take 0.4 mg by mouth every other day.     Allergies:   Patient has no known allergies.   Social History   Socioeconomic History   Marital status: Married  Spouse name: Not on file   Number of children: Not on file   Years of education: Not on file   Highest education level: Not on file  Occupational History   Not on file  Tobacco Use   Smoking status: Never   Smokeless tobacco: Never  Vaping Use   Vaping Use: Never used  Substance and Sexual Activity   Alcohol use: Not Currently   Drug use: Never   Sexual activity: Not on file  Other Topics Concern   Not on file   Social History Narrative   Not on file   Social Determinants of Health   Financial Resource Strain: Not on file  Food Insecurity: Not on file  Transportation Needs: Not on file  Physical Activity: Not on file  Stress: Not on file  Social Connections: Not on file     Family History: Family history includes father had ICD in 77s, but he does not know details.  ROS:   Please see the history of present illness.     All other systems reviewed and are negative.  EKGs/Labs/Other Studies Reviewed:    The following studies were reviewed today:   EKG:   01/12/2022: Normal sinus rhythm, nonspecific T wave flattening, rate 83  Recent Labs: 11/09/2021: Hemoglobin 15.3 01/12/2022: BUN 16; Creatinine, Ser 0.97; Potassium 3.8; Sodium 140  Recent Lipid Panel    Component Value Date/Time   CHOL 161 01/12/2022 1532   TRIG 296 (H) 01/12/2022 1532   HDL 28 (L) 01/12/2022 1532   CHOLHDL 5.8 (H) 01/12/2022 1532   LDLCALC 84 01/12/2022 1532    Physical Exam:    VS:  BP 126/80   Pulse 78   Ht '5\' 10"'$  (1.778 m)   Wt 198 lb 9.6 oz (90.1 kg)   SpO2 98%   BMI 28.50 kg/m     Wt Readings from Last 3 Encounters:  03/17/22 198 lb 9.6 oz (90.1 kg)  03/11/22 197 lb 6.4 oz (89.5 kg)  01/12/22 202 lb 9.6 oz (91.9 kg)     GEN:  Well nourished, well developed in no acute distress HEENT: Normal NECK: No JVD; No carotid bruits LYMPHATICS: No lymphadenopathy CARDIAC: RRR, no murmurs, rubs, gallops RESPIRATORY:  Clear to auscultation without rales, wheezing or rhonchi  ABDOMEN: Soft, non-tender, non-distended MUSCULOSKELETAL:  No edema; No deformity  SKIN: Warm and dry NEUROLOGIC:  Alert and oriented x 3 PSYCHIATRIC:  Normal affect   ASSESSMENT:    1. Aortic dilatation (HCC)   2. Chest pain of uncertain etiology   3. Palpitations   4. Essential hypertension   5. Hyperlipidemia, unspecified hyperlipidemia type     PLAN:    Chest pain: Atypical in description.  Echocardiogram  01/28/2022 showed EF 55 to 60%, mild anterior septal hypokinesis, normal RV function, no significant valvular disease, dilated aorta measuring 45 mm at aortic root and 42 mm and ascending aorta.  Coronary CTA on 02/18/2022 showed no CAD, calcium score 0, dilated aorta measuring 42 mm at aortic root and ascending aorta; also noted to have likely small loculated pleural fluid collection.   Palpitations/irregular heart rhythm: Zio patch x14 days on 02/24/2022 showed 1 episode of NSVT lasting 4 beats, 23 episodes of SVT with longest lasting 25 seconds, occasional PACs (2.0%) and occasional PVCs (2.7%). -Denies any recent palpitations.  Discussed that we could start medication to suppress extra beats and SVT but he prefers to hold off at this time  Splenic artery aneurysm: status post endovascular repair with stenting  11/09/2021.  Follows with Dr. Trula Slade in vascular surgery -Continue aspirin 81 mg daily  Hypertension: On losartan-HCTZ 50-12.5 mg daily.  Appears controlled  Hyperlipidemia: LDL 84 on 01/12/2022.  Calcium score is 0 on 02/18/2022  Loculated pleural effusion: Noted on CTA chest 02/18/2022, referred to pulmonology.  Dilated aorta: Coronary CTA on 02/18/2022 showed dilated aorta measuring 42 mm at aortic root and ascending aorta.  Will monitor, plan echocardiogram in 1 year  RTC in 1 year   Medication Adjustments/Labs and Tests Ordered: Current medicines are reviewed at length with the patient today.  Concerns regarding medicines are outlined above.  Orders Placed This Encounter  Procedures   ECHOCARDIOGRAM COMPLETE   No orders of the defined types were placed in this encounter.   Patient Instructions  Medication Instructions:  Your physician recommends that you continue on your current medications as directed. Please refer to the Current Medication list given to you today.  *If you need a refill on your cardiac medications before your next appointment, please call your  pharmacy*  Testing/Procedures: Your physician has requested that you have an echocardiogram in 1 YEAR. Echocardiography is a painless test that uses sound waves to create images of your heart. It provides your doctor with information about the size and shape of your heart and how well your heart's chambers and valves are working. This procedure takes approximately one hour. There are no restrictions for this procedure. Please do NOT wear cologne, perfume, aftershave, or lotions (deodorant is allowed). Please arrive 15 minutes prior to your appointment time. Follow-Up: At [facility], you and your health needs are our priority.  As part of our continuing mission to provide you with exceptional heart care, we have created designated Provider Care Teams.  These Care Teams include your primary Cardiologist (physician) and Advanced Practice Providers (APPs -  Physician Assistants and Nurse Practitioners) who all work together to provide you with the care you need, when you need it.  We recommend signing up for the patient portal called "MyChart".  Sign up information is provided on this After Visit Summary.  MyChart is used to connect with patients for Virtual Visits (Telemedicine).  Patients are able to view lab/test results, encounter notes, upcoming appointments, etc.  Non-urgent messages can be sent to your provider as well.   To learn more about what you can do with MyChart, go to [email or web address].    Your next appointment:   12 month(s)  The format for your next appointment:   In Person  Provider:   Dr. Gardiner Rhyme          Signed, Donato Heinz, MD  03/17/2022 4:12 PM    [facility]

## 2022-03-23 DIAGNOSIS — D125 Benign neoplasm of sigmoid colon: Secondary | ICD-10-CM | POA: Diagnosis not present

## 2022-03-23 DIAGNOSIS — K648 Other hemorrhoids: Secondary | ICD-10-CM | POA: Diagnosis not present

## 2022-03-23 DIAGNOSIS — Z1211 Encounter for screening for malignant neoplasm of colon: Secondary | ICD-10-CM | POA: Diagnosis not present

## 2022-03-23 DIAGNOSIS — K644 Residual hemorrhoidal skin tags: Secondary | ICD-10-CM | POA: Diagnosis not present

## 2022-03-23 DIAGNOSIS — K635 Polyp of colon: Secondary | ICD-10-CM | POA: Diagnosis not present

## 2022-03-23 DIAGNOSIS — D122 Benign neoplasm of ascending colon: Secondary | ICD-10-CM | POA: Diagnosis not present

## 2022-04-01 DIAGNOSIS — C672 Malignant neoplasm of lateral wall of bladder: Secondary | ICD-10-CM | POA: Diagnosis not present

## 2022-04-22 DIAGNOSIS — C672 Malignant neoplasm of lateral wall of bladder: Secondary | ICD-10-CM | POA: Diagnosis not present

## 2022-04-27 DIAGNOSIS — E782 Mixed hyperlipidemia: Secondary | ICD-10-CM | POA: Diagnosis not present

## 2022-04-27 DIAGNOSIS — Z125 Encounter for screening for malignant neoplasm of prostate: Secondary | ICD-10-CM | POA: Diagnosis not present

## 2022-04-27 DIAGNOSIS — I10 Essential (primary) hypertension: Secondary | ICD-10-CM | POA: Diagnosis not present

## 2022-04-27 DIAGNOSIS — D582 Other hemoglobinopathies: Secondary | ICD-10-CM | POA: Diagnosis not present

## 2022-04-29 DIAGNOSIS — E782 Mixed hyperlipidemia: Secondary | ICD-10-CM | POA: Diagnosis not present

## 2022-04-29 DIAGNOSIS — D582 Other hemoglobinopathies: Secondary | ICD-10-CM | POA: Diagnosis not present

## 2022-04-29 DIAGNOSIS — Z125 Encounter for screening for malignant neoplasm of prostate: Secondary | ICD-10-CM | POA: Diagnosis not present

## 2022-05-10 ENCOUNTER — Ambulatory Visit: Payer: BC Managed Care – PPO | Admitting: Cardiology

## 2022-11-08 ENCOUNTER — Other Ambulatory Visit: Payer: Self-pay | Admitting: *Deleted

## 2022-11-08 DIAGNOSIS — R109 Unspecified abdominal pain: Secondary | ICD-10-CM

## 2022-11-14 DIAGNOSIS — D582 Other hemoglobinopathies: Secondary | ICD-10-CM | POA: Diagnosis not present

## 2022-11-14 DIAGNOSIS — K921 Melena: Secondary | ICD-10-CM | POA: Diagnosis not present

## 2022-11-14 DIAGNOSIS — K219 Gastro-esophageal reflux disease without esophagitis: Secondary | ICD-10-CM | POA: Diagnosis not present

## 2022-11-15 DIAGNOSIS — K921 Melena: Secondary | ICD-10-CM | POA: Diagnosis not present

## 2022-11-16 NOTE — Progress Notes (Signed)
HISTORY AND PHYSICAL     CC:  follow up Requesting Provider:  No ref. provider found  HPI: John Patrick is a 62 y.o. (1961-04-30) male who is s/p endovascular repair of splenic artery aneurysm on 11/09/2021 by Dr. Myra Gianotti.    He was last seen on 12/27/2021 and at that time, he had recently undergone tx for bladder cancer and was recovering.   His u/s revealed successful exclusion of the aneurysm.  He was to finish his plavix rx and then be maintained on baby asa indefinitely.    He comes in today for follow up.  He tells me that he ultimately did not have bladder cancer and the lesions were benign.  He states he had a black stool in the past week but this has resolved.  He states he has been taking his asa most of the time.  He shows me a picture of his leg with varicose veins.  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  He denies any leg pain or swelling or claudication.  He denies any abdominal pain.    The pt is not on a statin for cholesterol management.  The pt is on a daily aspirin.   Other AC:  none The pt is on ARB, diuretic for hypertension.   The pt not diabetic.   Tobacco hx:  never  Pt does not have family hx of AAA.  Past Medical History:  Diagnosis Date   Bladder cancer [facility])    urologist-- dr Marlou Porch   BPH (benign prostatic hyperplasia)    H/O acute prostatitis    Hypertension    Wears glasses     Past Surgical History:  Procedure Laterality Date   ABDOMINAL AORTOGRAM W/LOWER EXTREMITY N/A 11/09/2021   Procedure: ABDOMINAL AORTOGRAM W/LOWER EXTREMITY;  Surgeon: Nada Libman, MD;  Location: [facility];  Service: Cardiovascular;  Laterality: N/A;   NO PAST SURGERIES     PERIPHERAL VASCULAR INTERVENTION  11/09/2021   Procedure: PERIPHERAL VASCULAR INTERVENTION;  Surgeon: Nada Libman, MD;  Location: [facility];  Service: Cardiovascular;;  Splenic Artery   TRANSURETHRAL RESECTION OF BLADDER TUMOR WITH MITOMYCIN-C Bilateral 09/10/2021   Procedure: TRANSURETHRAL  RESECTION OF BLADDER TUMOR WITH POST OPERATIVE INSTILLATION OF GEMCITABINE BILATERAL RETROGRADE PYELOGRAM;  Surgeon: Crist Fat, MD;  Location: [facility];  Service: Urology;  Laterality: Bilateral;    Social History   Socioeconomic History   Marital status: Married    Spouse name: Not on file   Number of children: Not on file   Years of education: Not on file   Highest education level: Not on file  Occupational History   Not on file  Tobacco Use   Smoking status: Never   Smokeless tobacco: Never  Vaping Use   Vaping Use: Never used  Substance and Sexual Activity   Alcohol use: Not Currently   Drug use: Never   Sexual activity: Not on file  Other Topics Concern   Not on file  Social History Narrative   Not on file   Social Determinants of Health   Financial Resource Strain: Not on file  Food Insecurity: Not on file  Transportation Needs: Not on file  Physical Activity: Not on file  Stress: Not on file  Social Connections: Not on file  Intimate Partner Violence: Not on file    Family History  Problem Relation Age of Onset   Lung cancer Mother     Current Outpatient Medications  Medication Sig Dispense  Refill   losartan-hydrochlorothiazide (HYZAAR) 50-12.5 MG tablet Take 1 tablet by mouth daily.     Multiple Vitamin (MULTIVITAMIN WITH MINERALS) TABS tablet Take 1 tablet by mouth 3 (three) times a week.     tamsulosin (FLOMAX) 0.4 MG CAPS capsule Take 0.4 mg by mouth every other day.     No current facility-administered medications for this visit.    No Known Allergies   REVIEW OF SYSTEMS:   [X]  denotes positive finding, [ ]  denotes negative finding Cardiac  Comments:  Chest pain or chest pressure:    Shortness of breath upon exertion:    Short of breath when lying flat:    Irregular heart rhythm:        Vascular    Pain in calf, thigh, or hip brought on by ambulation:    Pain in feet at night that wakes you up from your sleep:      Blood clot in your veins:    Leg swelling:         Pulmonary    Oxygen at home:    Productive cough:     Wheezing:         Neurologic    Sudden weakness in arms or legs:     Sudden numbness in arms or legs:     Sudden onset of difficulty speaking or slurred speech:    Temporary loss of vision in one eye:     Problems with dizziness:         Gastrointestinal    Blood in stool:     Vomited blood:         Genitourinary    Burning when urinating:     Blood in urine:        Psychiatric    Major depression:         Hematologic    Bleeding problems:    Problems with blood clotting too easily:        Skin    Rashes or ulcers:        Constitutional    Fever or chills:      PHYSICAL EXAMINATION:  Today's Vitals   11/21/22 0820  BP: 126/86  Pulse: 72  Resp: 18  Temp: (!) 97.3 F (36.3 C)  TempSrc: Temporal  SpO2: 98%  Weight: 189 lb 9.6 oz (86 kg)  Height: 5\' 10"  (1.778 m)   Body mass index is 27.2 kg/m.   General:  WDWN in NAD; vital signs documented above Gait: Not observed HENT: WNL, normocephalic Pulmonary: normal non-labored breathing Cardiac: regular HR, without carotid bruits Abdomen: soft, NT; aortic pulse is not palpable Skin: without rashes Vascular Exam/Pulses:  Right Left  Radial 2+ (normal) 2+ (normal)   Extremities: without open wounds;  Musculoskeletal: no muscle wasting or atrophy  Neurologic: A&O X 3 Psychiatric:  The pt has Normal affect.   Non-Invasive Vascular Imaging on 11/21/2022:   Summary:  Mesenteric:  Normal Superior Mesenteric artery, Celiac artery and Splenic artery findings.  Patent splenic artery stent with a maximum diameter of 0.86 cm x 0.92 cm     ASSESSMENT/PLAN:: 62 y.o. male here for follow up for hx of endovascular repair of splenic artery aneurysm on 11/09/2021 by Dr. Myra Gianotti.   -pt doing well without abdominal pain. His u/s today reveals maximum diameter of 0.92cm (3.9cm when found in 2023).  Plan for  return in one year for repeat duplex.   -discussed with him to continue his baby asa.  Discussed that if  he has more black stools, he should see his PCP.  Discussed if this happens, they may have to stop his asa until resolved.  -he will call with any concerns.    Doreatha Massed, [facility] [phone]  Clinic MD:   Myra Gianotti

## 2022-11-21 ENCOUNTER — Ambulatory Visit (INDEPENDENT_AMBULATORY_CARE_PROVIDER_SITE_OTHER): Payer: BC Managed Care – PPO | Admitting: Physician Assistant

## 2022-11-21 ENCOUNTER — Ambulatory Visit (HOSPITAL_COMMUNITY)
Admission: RE | Admit: 2022-11-21 | Discharge: 2022-11-21 | Disposition: A | Discharge status: Home / Self Care | Payer: BC Managed Care – PPO | Source: Ambulatory Visit | Attending: Surgery | Admitting: Surgery

## 2022-11-21 VITALS — BP 126/86 | HR 72 | Temp 97.3°F | Resp 18 | Ht 70.0 in | Wt 189.6 lb

## 2022-11-21 DIAGNOSIS — R109 Unspecified abdominal pain: Secondary | ICD-10-CM | POA: Diagnosis not present

## 2022-11-21 DIAGNOSIS — I728 Aneurysm of other specified arteries: Secondary | ICD-10-CM

## 2022-12-03 ENCOUNTER — Other Ambulatory Visit: Payer: Self-pay

## 2022-12-03 DIAGNOSIS — I728 Aneurysm of other specified arteries: Secondary | ICD-10-CM

## 2022-12-16 ENCOUNTER — Telehealth: Payer: Self-pay | Admitting: Cardiology

## 2022-12-16 NOTE — Telephone Encounter (Signed)
Left vm (cell ph) to schedule Echo in October 2024.  J.Britt, 12-16-22

## 2023-01-27 ENCOUNTER — Telehealth (HOSPITAL_COMMUNITY): Payer: Self-pay | Admitting: Cardiology

## 2023-01-27 ENCOUNTER — Telehealth: Payer: Self-pay | Admitting: Cardiology

## 2023-01-27 NOTE — Telephone Encounter (Signed)
Patient cancelled echocardiogram for reason below: 01/27/2023 8:58 AM ZO:[id number], JASMIN B  Cancel Rsn: Patient (Stated he did not need this test)  Order will be removed from the echo WQ.

## 2023-01-27 NOTE — Telephone Encounter (Signed)
Patient canceled echo test today.

## 2023-02-08 DIAGNOSIS — Z0000 Encounter for general adult medical examination without abnormal findings: Secondary | ICD-10-CM | POA: Diagnosis not present

## 2023-02-08 DIAGNOSIS — K921 Melena: Secondary | ICD-10-CM | POA: Diagnosis not present

## 2023-02-08 DIAGNOSIS — I10 Essential (primary) hypertension: Secondary | ICD-10-CM | POA: Diagnosis not present

## 2023-02-08 DIAGNOSIS — E782 Mixed hyperlipidemia: Secondary | ICD-10-CM | POA: Diagnosis not present

## 2023-02-08 DIAGNOSIS — Z125 Encounter for screening for malignant neoplasm of prostate: Secondary | ICD-10-CM | POA: Diagnosis not present

## 2023-02-09 DIAGNOSIS — K921 Melena: Secondary | ICD-10-CM | POA: Diagnosis not present

## 2023-02-09 DIAGNOSIS — E782 Mixed hyperlipidemia: Secondary | ICD-10-CM | POA: Diagnosis not present

## 2023-02-09 DIAGNOSIS — Z125 Encounter for screening for malignant neoplasm of prostate: Secondary | ICD-10-CM | POA: Diagnosis not present

## 2023-02-20 ENCOUNTER — Other Ambulatory Visit (HOSPITAL_COMMUNITY): Payer: BC Managed Care – PPO

## 2023-04-17 ENCOUNTER — Telehealth: Payer: Self-pay

## 2023-04-17 NOTE — Telephone Encounter (Signed)
Called patient left message on personal voice mail to call back.Advised she is past due to see Dr.Schumann.I was calling to schedule appointment.

## 2023-06-01 DIAGNOSIS — H524 Presbyopia: Secondary | ICD-10-CM | POA: Diagnosis not present

## 2023-06-01 DIAGNOSIS — H2513 Age-related nuclear cataract, bilateral: Secondary | ICD-10-CM | POA: Diagnosis not present

## 2023-06-01 DIAGNOSIS — H5213 Myopia, bilateral: Secondary | ICD-10-CM | POA: Diagnosis not present

## 2024-02-13 DIAGNOSIS — E782 Mixed hyperlipidemia: Secondary | ICD-10-CM | POA: Diagnosis not present

## 2024-02-13 DIAGNOSIS — I10 Essential (primary) hypertension: Secondary | ICD-10-CM | POA: Diagnosis not present

## 2024-02-13 DIAGNOSIS — Z0000 Encounter for general adult medical examination without abnormal findings: Secondary | ICD-10-CM | POA: Diagnosis not present

## 2024-02-13 DIAGNOSIS — D582 Other hemoglobinopathies: Secondary | ICD-10-CM | POA: Diagnosis not present

## 2024-02-13 DIAGNOSIS — Z125 Encounter for screening for malignant neoplasm of prostate: Secondary | ICD-10-CM | POA: Diagnosis not present

## 2024-02-14 DIAGNOSIS — D582 Other hemoglobinopathies: Secondary | ICD-10-CM | POA: Diagnosis not present

## 2024-02-14 DIAGNOSIS — E782 Mixed hyperlipidemia: Secondary | ICD-10-CM | POA: Diagnosis not present

## 2024-02-14 DIAGNOSIS — Z125 Encounter for screening for malignant neoplasm of prostate: Secondary | ICD-10-CM | POA: Diagnosis not present

## 2024-02-15 DIAGNOSIS — R739 Hyperglycemia, unspecified: Secondary | ICD-10-CM | POA: Diagnosis not present

## 2024-02-29 DIAGNOSIS — E119 Type 2 diabetes mellitus without complications: Secondary | ICD-10-CM | POA: Diagnosis not present

## 2024-02-29 DIAGNOSIS — Z6828 Body mass index (BMI) 28.0-28.9, adult: Secondary | ICD-10-CM | POA: Diagnosis not present
